# Patient Record
Sex: Male | Born: 1975
Health system: Southern US, Community
[De-identification: ages and names within clinical notes are randomized; demographics above are authoritative.]

## PROBLEM LIST (undated history)

## (undated) DIAGNOSIS — J9819 Other pulmonary collapse: Secondary | ICD-10-CM

## (undated) DIAGNOSIS — M722 Plantar fascial fibromatosis: Secondary | ICD-10-CM

## (undated) HISTORY — PX: KNEE ARTHROSCOPY: SUR90

## (undated) HISTORY — DX: Other pulmonary collapse: J98.19

## (undated) HISTORY — DX: Plantar fascial fibromatosis: M72.2

---

## 2013-01-09 ENCOUNTER — Emergency Department (HOSPITAL_COMMUNITY)
Admission: EM | Admit: 2013-01-09 | Discharge: 2013-01-09 | Disposition: A | Discharge status: Home / Self Care | Payer: Managed Care, Other (non HMO) | Source: Home / Self Care

## 2013-01-09 ENCOUNTER — Encounter (HOSPITAL_COMMUNITY): Payer: Self-pay | Admitting: Emergency Medicine

## 2013-01-09 DIAGNOSIS — J029 Acute pharyngitis, unspecified: Secondary | ICD-10-CM

## 2013-01-09 NOTE — ED Notes (Signed)
Patient reports having strep throat, history of the same.  Throat swollen, aches and chills.  Reports child is sick.  Onset of symptoms yesterday afternoon

## 2013-01-09 NOTE — ED Provider Notes (Signed)
Bryan Willis is a 37 y.o. male who presents to Urgent Care today for sore throat starting yesterday associated with body aches and a mild cough and chills. No fevers nausea vomiting or diarrhea. This is similar to prior episodes of strep throat. He has not tried any medications for this. He continues taking his regular daily over-the-counter Zyrtec for seasonal allergies. He has positive sick contacts. His son is sick with a similar illness.   PMH reviewed. Healthy otherwise History  Substance Use Topics  . Smoking status: Never Smoker   . Smokeless tobacco: Not on file  . Alcohol Use: Yes   ROS as above Medications reviewed. No current facility-administered medications for this encounter.   Current Outpatient Prescriptions  Medication Sig Dispense Refill  . cetirizine (ZYRTEC) 10 MG tablet Take 10 mg by mouth daily.        Exam:  BP 117/76  Pulse 58  Temp(Src) 98 F (36.7 C) (Oral)  Resp 16  SpO2 99% Gen: Well NAD HEENT: EOMI,  MMM, mildly erythematous posterior pharynx without exudate. Bilateral anterior cervical lymphadenopathy present Lungs: CTABL Nl WOB Heart: RRR no MRG Abd: NABS, NT, ND Exts: Non edematous BL  LE, warm and well perfused.   Results for orders placed during the hospital encounter of 01/09/13 (from the past 24 hour(s))  POCT RAPID STREP A ([facility]RG CARE ONLY)     Status: None   Collection Time    01/09/13 10:08 AM      Result Value Range   Streptococcus, Group A Screen (Direct) NEGATIVE  NEGATIVE   No results found.  Assessment and Plan: 37 y.o. male with likely viral pharyngitis. Rapid strep test is negative, the culture is pending.  Plan to treat symptomatically with ibuprofen or Tylenol. Continue Zyrtec.  Will call patient with culture results if positive. Discussed warning signs or symptoms. Please see discharge instructions. Patient expresses understanding.       Rodolph Bong, MD 01/09/13 1021

## 2013-01-09 NOTE — Discharge Instructions (Signed)
Thank you for coming in today. You have a virus likely.  Please continue ibuprofen or tylenol.  Come back as needed.  We will call you with your test results.    Sore Throat A sore throat is pain, burning, irritation, or scratchiness of the throat. There is often pain or tenderness when swallowing or talking. A sore throat may be accompanied by other symptoms, such as coughing, sneezing, fever, and swollen neck glands. A sore throat is often the first sign of another sickness, such as a cold, flu, strep throat, or mononucleosis (commonly known as mono). Most sore throats go away without medical treatment. CAUSES  The most common causes of a sore throat include:  A viral infection, such as a cold, flu, or mono.  A bacterial infection, such as strep throat, tonsillitis, or whooping cough.  Seasonal allergies.  Dryness in the air.  Irritants, such as smoke or pollution.  Gastroesophageal reflux disease (GERD). HOME CARE INSTRUCTIONS   Only take over-the-counter medicines as directed by your caregiver.  Drink enough fluids to keep your urine clear or pale yellow.  Rest as needed.  Try using throat sprays, lozenges, or sucking on hard candy to ease any pain (if older than 4 years or as directed).  Sip warm liquids, such as broth, herbal tea, or warm water with honey to relieve pain temporarily. You may also eat or drink cold or frozen liquids such as frozen ice pops.  Gargle with salt water (mix 1 tsp salt with 8 oz of water).  Do not smoke and avoid secondhand smoke.  Put a cool-mist humidifier in your bedroom at night to moisten the air. You can also turn on a hot shower and sit in the bathroom with the door closed for 5 10 minutes. SEEK IMMEDIATE MEDICAL CARE IF:  You have difficulty breathing.  You are unable to swallow fluids, soft foods, or your saliva.  You have increased swelling in the throat.  Your sore throat does not get better in 7 days.  You have nausea and  vomiting.  You have a fever or persistent symptoms for more than 2 3 days.  You have a fever and your symptoms suddenly get worse. MAKE SURE YOU:   Understand these instructions.  Will watch your condition.  Will get help right away if you are not doing well or get worse. Document Released: 07/12/2004 Document Revised: 05/21/2012 Document Reviewed: 02/10/2012 [facility] Patient Information 2014 [place], [place].

## 2013-01-11 LAB — CULTURE, GROUP A STREP

## 2015-03-15 ENCOUNTER — Encounter: Payer: Self-pay | Admitting: Internal Medicine

## 2016-01-18 ENCOUNTER — Encounter: Payer: Self-pay | Admitting: Internal Medicine

## 2016-03-06 ENCOUNTER — Ambulatory Visit (INDEPENDENT_AMBULATORY_CARE_PROVIDER_SITE_OTHER): Payer: 59

## 2016-03-06 ENCOUNTER — Other Ambulatory Visit: Payer: Self-pay | Admitting: *Deleted

## 2016-03-06 ENCOUNTER — Ambulatory Visit (INDEPENDENT_AMBULATORY_CARE_PROVIDER_SITE_OTHER): Payer: 59 | Admitting: Podiatry

## 2016-03-06 VITALS — BP 110/70 | HR 46 | Resp 16 | Ht 72.0 in | Wt 195.0 lb

## 2016-03-06 DIAGNOSIS — M79671 Pain in right foot: Secondary | ICD-10-CM

## 2016-03-06 DIAGNOSIS — M722 Plantar fascial fibromatosis: Secondary | ICD-10-CM | POA: Diagnosis not present

## 2016-03-06 MED ORDER — MELOXICAM 15 MG PO TABS: 15.0000 mg | ORAL_TABLET | Freq: Every day | ORAL | 3 refills | Status: DC

## 2016-03-06 MED ORDER — METHYLPREDNISOLONE 4 MG PO TBPK: ORAL_TABLET | ORAL | 0 refills | Status: DC

## 2016-03-06 NOTE — Patient Instructions (Signed)

## 2016-03-06 NOTE — Progress Notes (Signed)
   Subjective:    Patient ID: Bryan Willis, male    DOB: December 20, 1975, 40 y.o.   MRN: [id number]  HPI  Chief Complaint  Patient presents with  . Foot Pain    right heel pain x 8 mo ... pt states pain is worse first thing in the a.m. and after periods of rest.     States this seems to be getting worse rather than better    Review of Systems  All other systems reviewed and are negative.      Objective:   Physical Exam vital signs are stable alert and oriented 3. Pulses are palpable. Neurologic sensorium is intact. Deep tendon reflexes are intact. Muscle strength +5 over 5 dorsiflexion plantar flexors and inverters everters all into the musculature is intact. Orthopedic evaluation demonstrates all joints distal to the ankle for range of motion without crepitation. His severe pain on palpation medial calcaneal tubercle of the right heel. Radiographs taken today do demonstrate soft tissue increase in density of the plantar fascia calcaneal insertion site with small plantar distally oriented calcaneal heel spur. Cutaneous evaluation does not demonstrate any type of open lesions or wounds.        Assessment & Plan:  Plantar fasciitis right heel.  Plan: I injected the right heel today with Kenalog and local anesthetic placed him on a Medrol Dosepak to be followed by meloxicam. Dispensed a plantar fascia brace and a night splint. Discussed appropriate shoe gear stretching exercises ice therapy she can modifications area we discussed the etiology pathology conservative versus surgical therapies. Also dispensed both oral and written home going stretching instructions. I will follow up with him in 1 month.

## 2016-04-05 ENCOUNTER — Ambulatory Visit: Payer: 59 | Admitting: Podiatry

## 2016-04-10 ENCOUNTER — Ambulatory Visit (INDEPENDENT_AMBULATORY_CARE_PROVIDER_SITE_OTHER): Payer: 59 | Admitting: Podiatry

## 2016-04-10 ENCOUNTER — Encounter: Payer: Self-pay | Admitting: Podiatry

## 2016-04-10 DIAGNOSIS — M722 Plantar fascial fibromatosis: Secondary | ICD-10-CM

## 2016-04-10 NOTE — Progress Notes (Signed)
He presents today for follow-up of his plantar fasciitis in his right heel. He states that he has been doing everything to best of his ability for the past month. He states that his foot is feeling much better proximally 75-80% improved at this point.  Objective: Vital signs are stable he's alert and oriented 3. Pulses are palpable. He retains pain on palpation medial calcaneal tubercle of the right heel that he'll was not as firm nor is it as warm as it was in the past.  Assessment: Resolving plantar fasciitis right foot. 80% improved.  Plan: We injected the right heel today with Kenalog and local anesthetic. I encouraged him to continue all conservative therapies including anti-inflammatories, ankle plantar fascia brace and night splint. Also discussed appropriate shoe gear shows no shows and ice therapy. Follow up with him in 1 month unless he does not need us.

## 2016-05-08 ENCOUNTER — Encounter (INDEPENDENT_AMBULATORY_CARE_PROVIDER_SITE_OTHER): Payer: 59 | Admitting: Podiatry

## 2016-05-08 NOTE — Progress Notes (Signed)
This encounter was created in error - please disregard.

## 2016-09-04 ENCOUNTER — Encounter: Payer: Self-pay | Admitting: Podiatry

## 2016-09-04 ENCOUNTER — Ambulatory Visit (INDEPENDENT_AMBULATORY_CARE_PROVIDER_SITE_OTHER): Payer: 59 | Admitting: Podiatry

## 2016-09-04 DIAGNOSIS — M722 Plantar fascial fibromatosis: Secondary | ICD-10-CM

## 2016-09-04 MED ORDER — METHYLPREDNISOLONE 4 MG PO TBPK: ORAL_TABLET | ORAL | 0 refills | Status: DC

## 2016-09-04 MED ORDER — DICLOFENAC SODIUM 75 MG PO TBEC: 75.0000 mg | DELAYED_RELEASE_TABLET | Freq: Two times a day (BID) | ORAL | 0 refills | Status: DC

## 2016-09-04 NOTE — Progress Notes (Signed)
He presents today for follow-up of his plantar fasciitis of his right heel. He has not been in since October 2017. He states that I was doing very well almost completely healed and heel pain came back with a vengeance. He states that he stopped taking meloxicam because it really didn't seem to help any better and the ibuprofen did.  Objective: Vital signs are stable alert and oriented 3. Pulses are palpable. Neurologic sensory is intact. Deep tendon reflexes are intact. Muscle strength is 5 over 5 dorsiflexion plantar flexors and inverters everters onto the musculatures intact has pain on palpation medial calcaneal tubercle of the right foot.  Assessment: Plantar fasciitis right foot.  Plan: Started over with his therapy including a Medrol Dosepak switched him from meloxicam to diclofenac. He will continue his plantar fascia brace and his night splint. Discussed appropriate shoe gear stretching exercises ice therapy and the need for orthotics. I reinjected his right heel once again today I will follow-up with him in 1 month

## 2016-10-02 ENCOUNTER — Encounter: Payer: Self-pay | Admitting: Podiatry

## 2016-10-02 ENCOUNTER — Ambulatory Visit (INDEPENDENT_AMBULATORY_CARE_PROVIDER_SITE_OTHER): Payer: 59 | Admitting: Podiatry

## 2016-10-02 DIAGNOSIS — M722 Plantar fascial fibromatosis: Secondary | ICD-10-CM | POA: Diagnosis not present

## 2016-10-02 NOTE — Progress Notes (Signed)
He presents today states that he is approximately 50% improved.  Objective: Vital signs are stable he is alert and oriented 3. Pulses are palpable. He has pain on palpation mucogingival the right heel.  Assessment: Plantar fasciitis of the right heel.  Plan: We'll recommend orthotics today he was casted by Raiford Noble today. We will inject him when he picks up his orthotics if he is still painful at day and not improving.

## 2016-10-23 ENCOUNTER — Other Ambulatory Visit: Payer: 59

## 2016-12-16 HISTORY — PX: VASECTOMY: SHX75

## 2016-12-31 DIAGNOSIS — Z0000 Encounter for general adult medical examination without abnormal findings: Secondary | ICD-10-CM | POA: Diagnosis not present

## 2017-01-22 ENCOUNTER — Encounter: Payer: Self-pay | Admitting: Gastroenterology

## 2017-01-22 DIAGNOSIS — Z0000 Encounter for general adult medical examination without abnormal findings: Secondary | ICD-10-CM | POA: Diagnosis not present

## 2017-03-19 ENCOUNTER — Encounter: Payer: Self-pay | Admitting: Gastroenterology

## 2017-03-19 ENCOUNTER — Encounter (INDEPENDENT_AMBULATORY_CARE_PROVIDER_SITE_OTHER): Payer: Self-pay

## 2017-03-19 ENCOUNTER — Ambulatory Visit (INDEPENDENT_AMBULATORY_CARE_PROVIDER_SITE_OTHER): Payer: 59 | Admitting: Gastroenterology

## 2017-03-19 VITALS — BP 110/78 | HR 56 | Ht 72.0 in | Wt 208.2 lb

## 2017-03-19 DIAGNOSIS — Z800 Family history of malignant neoplasm of digestive organs: Secondary | ICD-10-CM

## 2017-03-19 MED ORDER — NA SULFATE-K SULFATE-MG SULF 17.5-3.13-1.6 GM/177ML PO SOLN: 1.0000 | Freq: Once | ORAL | 0 refills | Status: AC

## 2017-03-19 NOTE — Patient Instructions (Addendum)
If you are age 42 or older, your body mass index should be between 23-30. Your Body mass index is 28.24 kg/m. If this is out of the aforementioned range listed, please consider follow up with your Primary Care Provider.  If you are age 59 or younger, your body mass index should be between 19-25. Your Body mass index is 28.24 kg/m. If this is out of the aformentioned range listed, please consider follow up with your Primary Care Provider.   You have been scheduled for a colonoscopy. Please follow written instructions given to you at your visit today.  Please pick up your prep supplies at the pharmacy within the next 1-3 days. If you use inhalers (even only as needed), please bring them with you on the day of your procedure. Your physician has requested that you go to www.startemmi.com and enter the access code given to you at your visit today. This web site gives a general overview about your procedure. However, you should still follow specific instructions given to you by our office regarding your preparation for the procedure.  Thank you for choosing [facility]  Dr Amada Jupiter III

## 2017-03-19 NOTE — Progress Notes (Signed)
    [facility] Consult Note:  History: Bryan Willis 03/19/2017  Referring physician: Deland Pretty, MD  Reason for consult/chief complaint: Colon Cancer Screening (Family hx of colon cancer, patient has no compliants)   Subjective  HPI:  This is a 41 year old man referred for a family history of colon cancer. He is feeling well, denies abdominal pain, altered bowel habits or rectal bleeding. His appetite is good, and he has no chronic upper digestive symptoms. His father developed colon cancer at age 79, and was cured with surgery and chemotherapy.   ROS:  Review of Systems He denies chest pain dyspnea or dysuria  Past Medical History: Past Medical History:  Diagnosis Date  . Plantar fasciitis      Past Surgical History: Past Surgical History:  Procedure Laterality Date  . KNEE ARTHROSCOPY    . VASECTOMY  12/2016     Family History: Family History  Problem Relation Age of Onset  . Colon cancer Father   . Diabetes Father   Dad Dx age 76, surgery and CTX Pat uncles without CRC   Social History: Social History   Social History  . Marital status: Married    Spouse name: N/A  . Number of children: 3  . Years of education: N/A   Occupational History  . land developer    Social History Main Topics  . Smoking status: Never Smoker  . Smokeless tobacco: Never Used  . Alcohol use Yes     Comment: occ  . Drug use: No  . Sexual activity: Yes    Partners: Female   Other Topics Concern  . None   Social History Narrative  . None   Builder and Engineer, structural  Allergies: Allergies  Allergen Reactions  . Bactrim [Sulfamethoxazole-Trimethoprim] Other (See Comments)    Pt stated, "Gets the shakes; feels like has the flu and gets redness of the skin"    Outpatient Meds: Current Outpatient Prescriptions  Medication Sig Dispense Refill  . cetirizine (ZYRTEC) 10 MG tablet Take 10 mg by mouth daily.    . Na Sulfate-K Sulfate-Mg  Sulf 17.5-3.13-1.6 GM/180ML SOLN Take 1 kit by mouth once. 354 mL 0   No current facility-administered medications for this visit.       ___________________________________________________________________ Objective   Exam:  BP 110/78   Pulse (!) 56   Ht 6' (1.829 m)   Wt 208 lb 3.2 oz (94.4 kg)   BMI 28.24 kg/m    General: this is a(n) Well-appearing man   Eyes: sclera anicteric, no redness  ENT: oral mucosa moist without lesions, no cervical or supraclavicular lymphadenopathy, good dentition  CV: RRR without murmur, S1/S2, no JVD, no peripheral edema  Resp: clear to auscultation bilaterally, normal RR and effort noted  GI: soft, no tenderness, with active bowel sounds. No guarding or palpable organomegaly noted.  Skin; warm and dry, no rash or jaundice noted  Neuro: awake, alert and oriented x 3. Normal gross motor function and fluent speech  Labs:  Cbc normal in July with PCP   Assessment: Encounter Diagnosis  Name Primary?  . Family history of colon cancer in father Yes   We discussed current screening guidelines.  Plan:  Colonoscopy.  Thank you for the courtesy of this consult.  Please call me with any questions or concerns.  Nelida Meuse III  CC: Deland Pretty, MD

## 2017-04-12 ENCOUNTER — Encounter: Payer: Self-pay | Admitting: Gastroenterology

## 2017-04-15 ENCOUNTER — Encounter: Payer: Self-pay | Admitting: Gastroenterology

## 2017-04-24 ENCOUNTER — Other Ambulatory Visit: Payer: Self-pay

## 2017-04-24 ENCOUNTER — Encounter: Payer: Self-pay | Admitting: Gastroenterology

## 2017-04-24 ENCOUNTER — Ambulatory Visit (AMBULATORY_SURGERY_CENTER): Payer: 59 | Admitting: Gastroenterology

## 2017-04-24 VITALS — BP 118/68 | HR 58 | Temp 98.4°F | Resp 13 | Ht 72.0 in | Wt 208.0 lb

## 2017-04-24 DIAGNOSIS — Z1212 Encounter for screening for malignant neoplasm of rectum: Secondary | ICD-10-CM

## 2017-04-24 DIAGNOSIS — Z800 Family history of malignant neoplasm of digestive organs: Secondary | ICD-10-CM

## 2017-04-24 DIAGNOSIS — Z1211 Encounter for screening for malignant neoplasm of colon: Secondary | ICD-10-CM

## 2017-04-24 MED ORDER — SODIUM CHLORIDE 0.9 % IV SOLN: 500.0000 mL | INTRAVENOUS | Status: DC

## 2017-04-24 NOTE — Progress Notes (Signed)
No problems noted in the recovery room. maw 

## 2017-04-24 NOTE — Progress Notes (Signed)
Report given to PACU, vss 

## 2017-04-24 NOTE — Patient Instructions (Signed)
YOU HAD AN ENDOSCOPIC PROCEDURE TODAY AT THE [facility]:   Refer to the procedure report that was given to you for any specific questions about what was found during the examination.  If the procedure report does not answer your questions, please call your gastroenterologist to clarify.  If you requested that your care partner not be given the details of your procedure findings, then the procedure report has been included in a sealed envelope for you to review at your convenience later.  YOU SHOULD EXPECT: Some feelings of bloating in the abdomen. Passage of more gas than usual.  Walking can help get rid of the air that was put into your GI tract during the procedure and reduce the bloating. If you had a lower endoscopy (such as a colonoscopy or flexible sigmoidoscopy) you may notice spotting of blood in your stool or on the toilet paper. If you underwent a bowel prep for your procedure, you may not have a normal bowel movement for a few days.  Please Note:  You might notice some irritation and congestion in your nose or some drainage.  This is from the oxygen used during your procedure.  There is no need for concern and it should clear up in a day or so.  SYMPTOMS TO REPORT IMMEDIATELY:   Following lower endoscopy (colonoscopy or flexible sigmoidoscopy):  Excessive amounts of blood in the stool  Significant tenderness or worsening of abdominal pains  Swelling of the abdomen that is new, acute  Fever of 100F or higher   For urgent or emergent issues, a gastroenterologist can be reached at any hour by calling [phone].   DIET:  We do recommend a small meal at first, but then you may proceed to your regular diet.  Drink plenty of fluids but you should avoid alcoholic beverages for 24 hours.  ACTIVITY:  You should plan to take it easy for the rest of today and you should NOT DRIVE or use heavy machinery until tomorrow (because of the sedation medicines used during the test).     FOLLOW UP: Our staff will call the number listed on your records the next business day following your procedure to check on you and address any questions or concerns that you may have regarding the information given to you following your procedure. If we do not reach you, we will leave a message.  However, if you are feeling well and you are not experiencing any problems, there is no need to return our call.  We will assume that you have returned to your regular daily activities without incident.  If any biopsies were taken you will be contacted by phone or by letter within the next 1-3 weeks.  Please call us at [phone] if you have not heard about the biopsies in 3 weeks.    SIGNATURES/CONFIDENTIALITY: You and/or your care partner have signed paperwork which will be entered into your electronic medical record.  These signatures attest to the fact that that the information above on your After Visit Summary has been reviewed and is understood.  Full responsibility of the confidentiality of this discharge information lies with you and/or your care-partner.   Handouts were given to your care partner on hemorrhoids. You may resume your current medications today. Next colonoscopy for screening purposes is 5 years. Please call if any questions or concerns.

## 2017-04-24 NOTE — Op Note (Signed)
[facility] Patient Name: Bryan Willis Procedure Date: 04/24/2017 2:23 PM MRN: [id number] Endoscopist: Sherilyn CooterHenry L. Myrtie Neitheranis , MD Age: [age] Referring MD:  Date of Birth: 1975/11/27 Gender: Male Account #: [id number] Procedure:                Colonoscopy Indications:              Screening in patient at increased risk: Colorectal                            cancer in father before age 860, This is the                            patient's first colonoscopy Medicines:                Monitored Anesthesia Care Procedure:                Pre-Anesthesia Assessment:                           - Prior to the procedure, a History and Physical                            was performed, and patient medications and                            allergies were reviewed. The patient's tolerance of                            previous anesthesia was also reviewed. The risks                            and benefits of the procedure and the sedation                            options and risks were discussed with the patient.                            All questions were answered, and informed consent                            was obtained. Prior Anticoagulants: The patient has                            taken no previous anticoagulant or antiplatelet                            agents. ASA Grade Assessment: I - A normal, healthy                            patient. After reviewing the risks and benefits,                            the patient was deemed in satisfactory condition to  undergo the procedure.                           After obtaining informed consent, the colonoscope                            was passed under direct vision. Throughout the                            procedure, the patient's blood pressure, pulse, and                            oxygen saturations were monitored continuously. The                            Model CF-HQ190L [id number]) scope was introduced                             through the anus and advanced to the the cecum,                            identified by appendiceal orifice and ileocecal                            valve. The colonoscopy was performed without                            difficulty. The patient tolerated the procedure                            well. The quality of the bowel preparation was                            excellent. The ileocecal valve, appendiceal                            orifice, and rectum were photographed. The quality                            of the bowel preparation was evaluated using the                            BBPS [facility] Bowel Preparation Scale) with scores                            of: Right Colon = 3, Transverse Colon = 3 and Left                            Colon = 3 (entire mucosa seen well with no residual                            staining, small fragments of stool or opaque  liquid). The total BBPS score equals 9. The bowel                            preparation used was SUPREP. Scope In: 2:31:50 PM Scope Out: 2:42:15 PM Scope Withdrawal Time: 0 hours 8 minutes 29 seconds  Total Procedure Duration: 0 hours 10 minutes 25 seconds  Findings:                 The perianal and digital rectal examinations were                            normal.                           Internal hemorrhoids were found. The hemorrhoids                            were small and Grade I (internal hemorrhoids that                            do not prolapse).                           The exam was otherwise without abnormality on                            direct and retroflexion views. Complications:            No immediate complications. Estimated Blood Loss:     Estimated blood loss: none. Estimated blood loss:                            none. Impression:               - Internal hemorrhoids.                           - The examination was otherwise normal on direct                             and retroflexion views.                           - No specimens collected. Recommendation:           - Patient has a contact number available for                            emergencies. The signs and symptoms of potential                            delayed complications were discussed with the                            patient. Return to normal activities tomorrow.                            Written discharge instructions  were provided to the                            patient.                           - Resume previous diet.                           - Continue present medications.                           - Repeat colonoscopy in 5 years for screening                            purposes. Dale Ribeiro L. Myrtie Neitheranis, MD 04/24/2017 2:45:43 PM This report has been signed electronically.

## 2017-04-25 ENCOUNTER — Telehealth: Payer: Self-pay

## 2017-04-25 NOTE — Telephone Encounter (Signed)
  Follow up Call-  Call back number 04/24/2017  Post procedure Call Back phone  # [phone]  Permission to leave phone message Yes  Some recent data might be hidden     Patient questions:  Do you have a fever, pain , or abdominal swelling? No. Pain Score  0 *  Have you tolerated food without any problems? Yes.    Have you been able to return to your normal activities? Yes.    Do you have any questions about your discharge instructions: Diet   No. Medications  No. Follow up visit  No.  Do you have questions or concerns about your Care? No.  Actions: * If pain score is 4 or above: No action needed, pain <4.

## 2018-01-22 DIAGNOSIS — Z0000 Encounter for general adult medical examination without abnormal findings: Secondary | ICD-10-CM | POA: Diagnosis not present

## 2018-03-27 DIAGNOSIS — Z23 Encounter for immunization: Secondary | ICD-10-CM | POA: Diagnosis not present

## 2018-03-27 DIAGNOSIS — D72819 Decreased white blood cell count, unspecified: Secondary | ICD-10-CM | POA: Diagnosis not present

## 2018-03-27 DIAGNOSIS — Z800 Family history of malignant neoplasm of digestive organs: Secondary | ICD-10-CM | POA: Diagnosis not present

## 2018-03-27 DIAGNOSIS — Z0000 Encounter for general adult medical examination without abnormal findings: Secondary | ICD-10-CM | POA: Diagnosis not present

## 2019-03-09 ENCOUNTER — Telehealth: Payer: 59 | Admitting: Physician Assistant

## 2019-03-09 ENCOUNTER — Other Ambulatory Visit: Payer: Self-pay

## 2019-03-09 ENCOUNTER — Ambulatory Visit (HOSPITAL_COMMUNITY)
Admission: EM | Admit: 2019-03-09 | Discharge: 2019-03-09 | Disposition: A | Discharge status: Home / Self Care | Payer: BC Managed Care – PPO | Attending: Family Medicine | Admitting: Family Medicine

## 2019-03-09 ENCOUNTER — Encounter (HOSPITAL_COMMUNITY): Payer: Self-pay | Admitting: Emergency Medicine

## 2019-03-09 DIAGNOSIS — H6090 Unspecified otitis externa, unspecified ear: Secondary | ICD-10-CM

## 2019-03-09 DIAGNOSIS — H60331 Swimmer's ear, right ear: Secondary | ICD-10-CM | POA: Diagnosis not present

## 2019-03-09 MED ORDER — NEOMYCIN-POLYMYXIN-HC 3.5-10000-1 OT SUSP: 3.0000 [drp] | Freq: Three times a day (TID) | OTIC | 0 refills | Status: DC

## 2019-03-09 NOTE — ED Triage Notes (Signed)
Right ear pain since labor day weekend.  Since Thursday or Friday has noticed increased pain and stuffiness in ear.  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

## 2019-03-09 NOTE — Discharge Instructions (Signed)
Use the antibiotic 3 times a day until your pain resolves, probably a 3 to 5 days You must lay on your side with your barrier up, put the drops in, rest for 10 minutes and then go about your way

## 2019-03-09 NOTE — Progress Notes (Signed)
Hi Bryan Willis,  Thank you for the details.  I am concerned about your ear and feel your condition warrants further evaluation and I recommend that you be seen for a face to face office visit.  Depending on how swollen your ear is, you may need a wick placed in additional to antibiotic drops.    NOTE: If you entered your credit card information for this eVisit, you will not be charged. You may see a "hold" on your card for the $35 but that hold will drop off and you will not have a charge processed.  If you are having a true medical emergency please call 911.     For an urgent face to face visit, [facility] has four urgent care centers for your convenience:   . [facility]    [phone]                  Get Driving Directions  [address], [place] [address] . 10 am to 8 pm Monday-Friday . 12 pm to 8 pm Saturday-Sunday   . [facility] [place]                  Get Driving Directions  [address], [place], [place] [address] . 8 am to 8 pm Monday-Friday . 9 am to 6 pm Saturday . 11 am to 6 pm Sunday   . [facility] [place]                  Get Driving Directions   [address] [place],  [address] . 8 am to 8 pm Monday-Friday . 8 am to 4 pm Saturday-Sunday    . [facility] [place]                    Get Driving Directions  [phone]  [address], [place],  [address]  . Monday-Friday, 12 PM to 6 PM    Your e-visit answers were reviewed by a board certified advanced clinical practitioner to complete your personal care plan.  Thank you for using e-Visits.

## 2019-03-09 NOTE — ED Provider Notes (Signed)
[name]    CSN: [id number] Arrival date & time: 03/09/19  [place]      History   Chief Complaint Chief Complaint  Patient presents with  . Sore Throat  . Otalgia    HPI Bryan Willis is a 43 y.o. male.   HPI  Ear pain right ear [place].  It hurts to pull on the ear.  No drainage from the ear.  Hearing is normal.  No cough cold runny nose or sore throat.  He states his throat was scratchy on Sunday after screaming at a soccer game on Saturday, but this is better today.  No fever.  No suspected illness  Past Medical History:  Diagnosis Date  . Plantar fasciitis     There are no active problems to display for this patient.   Past Surgical History:  Procedure Laterality Date  . KNEE ARTHROSCOPY    . VASECTOMY  12/2016       Home Medications    Prior to Admission medications   Medication Sig Start Date End Date Taking? Authorizing Provider  cetirizine (ZYRTEC) 10 MG tablet Take 10 mg by mouth daily.   Yes [provider]  neomycin-polymyxin-hydrocortisone (CORTISPORIN) 3.5-10000-1 OTIC suspension Place 3 drops into the right ear 3 (three) times daily. 03/09/19   Raylene Everts, MD    Family History Family History  Problem Relation Age of Onset  . Colon cancer Father   . Diabetes Father     Social History Social History   Tobacco Use  . Smoking status: Never Smoker  . Smokeless tobacco: Never Used  Substance Use Topics  . Alcohol use: Yes    Comment: occ  . Drug use: No     Allergies   Bactrim [sulfamethoxazole-trimethoprim]   Review of Systems Review of Systems  Constitutional: Negative for chills and fever.  HENT: Positive for ear pain. Negative for sore throat.   Eyes: Negative for pain and visual disturbance.  Respiratory: Negative for cough and shortness of breath.   Cardiovascular: Negative for chest pain and palpitations.  Gastrointestinal: Negative for abdominal pain  and vomiting.  Genitourinary: Negative for dysuria and hematuria.  Musculoskeletal: Negative for arthralgias and back pain.  Skin: Negative for color change and rash.  Neurological: Negative for seizures and syncope.  All other systems reviewed and are negative.    Physical Exam Triage Vital Signs ED Triage Vitals  Enc Vitals Group     BP 03/09/19 1948 112/69     Pulse Rate 03/09/19 1948 (!) 56     Resp 03/09/19 1948 20     Temp 03/09/19 1948 98 F (36.7 C)     Temp Source 03/09/19 1948 Oral     SpO2 03/09/19 1948 99 %     Weight --      Height --      Head Circumference --      Peak Flow --      Pain Score 03/09/19 1944 8     Pain Loc --      Pain Edu? --      Excl. in [place]? --    No data found.  Updated Vital Signs BP 112/69 (BP Location: Right Arm)   Pulse (!) 56   Temp 98 F (36.7 C) (Oral)   Resp 20   SpO2 99%       Physical Exam Constitutional:      General: He is not in acute  distress.    Appearance: He is well-developed.  HENT:     Head: Normocephalic and atraumatic.     Right Ear: Tympanic membrane normal. Swelling and tenderness present. Tympanic membrane is not erythematous.     Left Ear: Tympanic membrane and ear canal normal.     Mouth/Throat:     Mouth: Mucous membranes are moist.     Pharynx: No pharyngeal swelling.  Eyes:     Conjunctiva/sclera: Conjunctivae normal.     Pupils: Pupils are equal, round, and reactive to light.  Neck:     Musculoskeletal: Normal range of motion.  Cardiovascular:     Rate and Rhythm: Normal rate.  Pulmonary:     Effort: Pulmonary effort is normal. No respiratory distress.  Abdominal:     General: There is no distension.     Palpations: Abdomen is soft.  Musculoskeletal: Normal range of motion.  Skin:    General: Skin is warm and dry.  Neurological:     Mental Status: He is alert.  Psychiatric:        Behavior: Behavior normal.      UC Treatments / Results  Labs (all labs ordered are listed, but  only abnormal results are displayed) Labs Reviewed - No data to display  EKG   Radiology No results found.  Procedures Procedures (including critical care time)  Medications Ordered in UC Medications - No data to display  Initial Impression / Assessment and Plan / UC Course  I have reviewed the triage vital signs and the nursing notes.  Pertinent labs & imaging results that were available during my care of the patient were reviewed by me and considered in my medical decision making (see chart for details).     Discussed otitis externa.  Treatment and expected recovery Final Clinical Impressions(s) / UC Diagnoses   Final diagnoses:  Acute swimmer's ear of right side     Discharge Instructions     Use the antibiotic 3 times a day until your pain resolves, probably a 3 to 5 days You must lay on your side with your barrier up, put the drops in, rest for 10 minutes and then go about your way   ED Prescriptions    Medication Sig Dispense Auth. Provider   neomycin-polymyxin-hydrocortisone (CORTISPORIN) 3.5-10000-1 OTIC suspension Place 3 drops into the right ear 3 (three) times daily. 10 mL Eustace MooreNelson, Sheza Strickland Sue, MD     PDMP not reviewed this encounter.   Eustace MooreNelson, Bernie Fobes Sue, MD 03/09/19 2038

## 2019-12-03 ENCOUNTER — Ambulatory Visit (HOSPITAL_COMMUNITY)
Admission: EM | Admit: 2019-12-03 | Discharge: 2019-12-03 | Disposition: A | Discharge status: Home / Self Care | Payer: BC Managed Care – PPO | Attending: Family Medicine | Admitting: Family Medicine

## 2019-12-03 ENCOUNTER — Encounter (HOSPITAL_COMMUNITY): Payer: Self-pay

## 2019-12-03 DIAGNOSIS — J069 Acute upper respiratory infection, unspecified: Secondary | ICD-10-CM | POA: Insufficient documentation

## 2019-12-03 DIAGNOSIS — Z20822 Contact with and (suspected) exposure to covid-19: Secondary | ICD-10-CM | POA: Insufficient documentation

## 2019-12-03 DIAGNOSIS — Z881 Allergy status to other antibiotic agents status: Secondary | ICD-10-CM | POA: Insufficient documentation

## 2019-12-03 DIAGNOSIS — H6692 Otitis media, unspecified, left ear: Secondary | ICD-10-CM | POA: Diagnosis not present

## 2019-12-03 MED ORDER — AMOXICILLIN 500 MG PO CAPS: 1000.0000 mg | ORAL_CAPSULE | Freq: Three times a day (TID) | ORAL | 0 refills | Status: AC

## 2019-12-03 NOTE — Discharge Instructions (Addendum)
Treating you for an ear infection Take the antibiotic as prescribed Ibuprofen for pain as needed.  Flonase and zyrtec.  Follow up as needed for continued or worsening symptoms

## 2019-12-03 NOTE — ED Triage Notes (Signed)
Pt c/o 4/10 constant dull left ear painx1 wk. Last night nasal congestion, sore throat started.

## 2019-12-03 NOTE — ED Provider Notes (Signed)
[facility]    CSN: [id number] Arrival date & time: 12/03/19  1034      History   Chief Complaint Chief Complaint  Patient presents with  . sore throat, runnynose    HPI Bryan Willis is a 44 y.o. male.   Patient is a 44 year old male who presents today with constant, dull left ear pain for the past week or so.  Symptoms have been constant.  Describes as pressure, aching.  Has not take anything for the symptoms.  Does have a history of seasonal allergies.  Not currently taking Flonase or Zyrtec.  Last night he started with some nasal congestion, mild sore throat.  No fevers, chills, bodies or night sweats.  ROS per HPI      Past Medical History:  Diagnosis Date  . Plantar fasciitis     There are no problems to display for this patient.   Past Surgical History:  Procedure Laterality Date  . KNEE ARTHROSCOPY    . VASECTOMY  12/2016       Home Medications    Prior to Admission medications   Medication Sig Start Date End Date Taking? Authorizing Provider  amoxicillin (AMOXIL) 500 MG capsule Take 2 capsules (1,000 mg total) by mouth 3 (three) times daily for 5 days. 12/03/19 12/08/19  Dahlia Byes A, NP  cetirizine (ZYRTEC) 10 MG tablet Take 10 mg by mouth daily.    [provider]    Family History Family History  Problem Relation Age of Onset  . Colon cancer Father   . Diabetes Father     Social History Social History   Tobacco Use  . Smoking status: Never Smoker  . Smokeless tobacco: Never Used  Vaping Use  . Vaping Use: Never used  Substance Use Topics  . Alcohol use: Yes    Comment: occ  . Drug use: No     Allergies   Bactrim [sulfamethoxazole-trimethoprim]   Review of Systems Review of Systems   Physical Exam Triage Vital Signs ED Triage Vitals  Enc Vitals Group     BP 12/03/19 1124 131/88     Pulse Rate 12/03/19 1124 (!) 55     Resp 12/03/19 1124 16     Temp 12/03/19 1124 97.6 F (36.4 C)     Temp  Source 12/03/19 1124 Oral     SpO2 12/03/19 1124 100 %     Weight 12/03/19 1125 210 lb (95.3 kg)     Height 12/03/19 1125 6' (1.829 m)     Head Circumference --      Peak Flow --      Pain Score 12/03/19 1125 4     Pain Loc --      Pain Edu? --      Excl. in [place]? --    No data found.  Updated Vital Signs BP 131/88   Pulse (!) 55   Temp 97.6 F (36.4 C) (Oral)   Resp 16   Ht 6' (1.829 m)   Wt 210 lb (95.3 kg)   SpO2 100%   BMI 28.48 kg/m   Visual Acuity Right Eye Distance:   Left Eye Distance:   Bilateral Distance:    Right Eye Near:   Left Eye Near:    Bilateral Near:     Physical Exam Vitals and nursing note reviewed.  Constitutional:      Appearance: Normal appearance.  HENT:     Head: Normocephalic and atraumatic.     Right Ear: Tympanic  membrane and ear canal normal.     Left Ear: Tympanic membrane is injected and retracted.     Nose: Nose normal.  Eyes:     Conjunctiva/sclera: Conjunctivae normal.  Pulmonary:     Effort: Pulmonary effort is normal.  Musculoskeletal:        General: Normal range of motion.     Cervical back: Normal range of motion.  Skin:    General: Skin is warm and dry.  Neurological:     Mental Status: He is alert.  Psychiatric:        Mood and Affect: Mood normal.      UC Treatments / Results  Labs (all labs ordered are listed, but only abnormal results are displayed) Labs Reviewed  SARS CORONAVIRUS 2 (TAT 6-24 HRS)    EKG   Radiology No results found.  Procedures Procedures (including critical care time)  Medications Ordered in UC Medications - No data to display  Initial Impression / Assessment and Plan / UC Course  I have reviewed the triage vital signs and the nursing notes.  Pertinent labs & imaging results that were available during my care of the patient were reviewed by me and considered in my medical decision making (see chart for details).     Left otitis media with associated viral URI. Treating  for ear infection with amoxicillin. Recommended Flonase and Zyrtec for nasal congestion, rhinorrhea and postnasal drip. Follow up as needed for continued or worsening symptoms  Final Clinical Impressions(s) / UC Diagnoses   Final diagnoses:  Left otitis media, unspecified otitis media type  Viral URI     Discharge Instructions     Treating you for an ear infection Take the antibiotic as prescribed Ibuprofen for pain as needed.  Flonase and zyrtec.  Follow up as needed for continued or worsening symptoms     ED Prescriptions    Medication Sig Dispense Auth. Provider   amoxicillin (AMOXIL) 500 MG capsule Take 2 capsules (1,000 mg total) by mouth 3 (three) times daily for 5 days. 30 capsule Josaphine Shimamoto A, NP     PDMP not reviewed this encounter.   Orvan July, NP 12/03/19 1444

## 2019-12-04 LAB — SARS CORONAVIRUS 2 (TAT 6-24 HRS): SARS Coronavirus 2: NEGATIVE

## 2019-12-15 ENCOUNTER — Ambulatory Visit (HOSPITAL_COMMUNITY)
Admission: EM | Admit: 2019-12-15 | Discharge: 2019-12-15 | Disposition: A | Discharge status: Home / Self Care | Payer: BC Managed Care – PPO | Attending: Physician Assistant | Admitting: Physician Assistant

## 2019-12-15 ENCOUNTER — Encounter (HOSPITAL_COMMUNITY): Payer: Self-pay | Admitting: Emergency Medicine

## 2019-12-15 ENCOUNTER — Other Ambulatory Visit: Payer: Self-pay

## 2019-12-15 DIAGNOSIS — H6502 Acute serous otitis media, left ear: Secondary | ICD-10-CM

## 2019-12-15 DIAGNOSIS — H6982 Other specified disorders of Eustachian tube, left ear: Secondary | ICD-10-CM | POA: Diagnosis not present

## 2019-12-15 MED ORDER — FLUTICASONE PROPIONATE 50 MCG/ACT NA SUSP: 1.0000 | Freq: Every day | NASAL | 0 refills | Status: DC

## 2019-12-15 NOTE — ED Triage Notes (Signed)
Left ear pain.  Patient was seen 6/17.  No improvement since that visit at [facility]

## 2019-12-15 NOTE — ED Provider Notes (Signed)
[facility]    CSN: [id number] Arrival date & time: 12/15/19  9024      History   Chief Complaint Chief Complaint  Patient presents with  . Otalgia    HPI Bryan Willis is a 44 y.o. male.   Patient returns for her evaluation of left ear pain.  He reports continued low start Flonase.  He has been compliant ear fullness and discomfort.  He reports he was seen on 12/03/2019 for similar.  He was placed on amoxicillin and Flonase.  Completed amoxicillin without any improvement.  He has been using the Flonase.  Pain is not worse however is not better.  He does report some diminished hearing and feeling pressure in the ear.  Pain is not overly bothersome.  He has not had fever or chills.  He is continue to have a low level nasal congestion.  Does have a history of eustachian tube dysfunction on the right ear and has seen ENT in the past.     Past Medical History:  Diagnosis Date  . Plantar fasciitis     There are no problems to display for this patient.   Past Surgical History:  Procedure Laterality Date  . KNEE ARTHROSCOPY    . VASECTOMY  12/2016       Home Medications    Prior to Admission medications   Medication Sig Start Date End Date Taking? Authorizing Provider  cetirizine (ZYRTEC) 10 MG tablet Take 10 mg by mouth daily.   Yes [provider]  fluticasone (FLONASE) 50 MCG/ACT nasal spray Place 1 spray into both nostrils daily. 12/15/19   Zaydah Nawabi, Veryl Speak, PA-C    Family History Family History  Problem Relation Age of Onset  . Colon cancer Father   . Diabetes Father     Social History Social History   Tobacco Use  . Smoking status: Never Smoker  . Smokeless tobacco: Never Used  Vaping Use  . Vaping Use: Never used  Substance Use Topics  . Alcohol use: Yes    Comment: occ  . Drug use: No     Allergies   Bactrim [sulfamethoxazole-trimethoprim]   Review of Systems Review of Systems   Physical Exam Triage Vital Signs ED  Triage Vitals  Enc Vitals Group     BP 12/15/19 0827 127/78     Pulse Rate 12/15/19 0827 (!) 56     Resp 12/15/19 0827 16     Temp 12/15/19 0827 97.7 F (36.5 C)     Temp Source 12/15/19 0827 Oral     SpO2 12/15/19 0827 98 %     Weight --      Height --      Head Circumference --      Peak Flow --      Pain Score 12/15/19 0840 2     Pain Loc --      Pain Edu? --      Excl. in [place]? --    No data found.  Updated Vital Signs BP 127/78 (BP Location: Left Arm)   Pulse (!) 56   Temp 97.7 F (36.5 C) (Oral)   Resp 16   SpO2 98%   Visual Acuity Right Eye Distance:   Left Eye Distance:   Bilateral Distance:    Right Eye Near:   Left Eye Near:    Bilateral Near:     Physical Exam Vitals and nursing note reviewed.  Constitutional:      Appearance: He is well-developed.  HENT:     Head: Normocephalic and atraumatic.     Right Ear: Tympanic membrane normal.     Ears:     Comments: Left tympanic membrane gray.  Nonbulging nonerythematous.  There is serous fluid present.  No pain with manipulation of the tragus or auricle.  Canal clear without sign of infection  No left-sided preauricular postauricular lymphadenopathy.    Nose: Congestion present.     Mouth/Throat:     Mouth: Mucous membranes are moist.     Pharynx: Oropharynx is clear.  Eyes:     Conjunctiva/sclera: Conjunctivae normal.  Cardiovascular:     Rate and Rhythm: Normal rate.  Pulmonary:     Effort: Pulmonary effort is normal. No respiratory distress.  Musculoskeletal:     Cervical back: Neck supple.  Skin:    General: Skin is warm and dry.  Neurological:     Mental Status: He is alert.      UC Treatments / Results  Labs (all labs ordered are listed, but only abnormal results are displayed) Labs Reviewed - No data to display  EKG   Radiology No results found.  Procedures Procedures (including critical care time)  Medications Ordered in UC Medications - No data to display  Initial  Impression / Assessment and Plan / UC Course  I have reviewed the triage vital signs and the nursing notes.  Pertinent labs & imaging results that were available during my care of the patient were reviewed by me and considered in my medical decision making (see chart for details).     #Eustachian tube dysfunction #Serous otitis Patient is a 44 year old presenting with eustachian tube dysfunction and serous otitis media of the left ear.  Given he had 0 response to amoxicillin I doubt infectious.  Most likely this is eustachian tube dysfunction.  I discussed that this can take time to resolve despite adequate treatments.  Recommended increasing Flonase to twice a day for a week and then continue it once daily.  Recommend continue Zyrtec.  Discussed adding pseudoephedrine for the next 2 to 3 days and use of ibuprofen for discomfort.  Recommended close follow-up with his primary care for monitoring symptoms and possible ENT appointment.  Patient is an established patient with an ENT and did offer he felt the improvement that this may be a viable option as well.  Patient verbalized understanding agreement the plan. Final Clinical Impressions(s) / UC Diagnoses   Final diagnoses:  Eustachian tube dysfunction, left  Acute serous otitis media of left ear, recurrence not specified     Discharge Instructions     Increase flonase to 2 times daily for 1 week Continue zyrtec daily Consider over the counter pseudophedrine per package labeling for 2-3 days  Take 2 regular strength ibuprofen every 6 hours for discomfort  Schedule follow up with your primary care in about 1 week for re-evaluation and possible ENT referral      ED Prescriptions    Medication Sig Dispense Auth. Provider   fluticasone (FLONASE) 50 MCG/ACT nasal spray Place 1 spray into both nostrils daily. 11.1 mL Kynedi Profitt, Veryl Speak, PA-C     PDMP not reviewed this encounter.   Hermelinda Medicus, PA-C 12/15/19 1013

## 2019-12-15 NOTE — Discharge Instructions (Signed)
Increase flonase to 2 times daily for 1 week Continue zyrtec daily Consider over the counter pseudophedrine per package labeling for 2-3 days  Take 2 regular strength ibuprofen every 6 hours for discomfort  Schedule follow up with your primary care in about 1 week for re-evaluation and possible ENT referral

## 2020-01-11 DIAGNOSIS — H9201 Otalgia, right ear: Secondary | ICD-10-CM | POA: Diagnosis not present

## 2020-01-11 DIAGNOSIS — H6982 Other specified disorders of Eustachian tube, left ear: Secondary | ICD-10-CM | POA: Diagnosis not present

## 2020-08-19 DIAGNOSIS — D485 Neoplasm of uncertain behavior of skin: Secondary | ICD-10-CM | POA: Diagnosis not present

## 2020-08-19 DIAGNOSIS — L814 Other melanin hyperpigmentation: Secondary | ICD-10-CM | POA: Diagnosis not present

## 2020-08-19 DIAGNOSIS — L820 Inflamed seborrheic keratosis: Secondary | ICD-10-CM | POA: Diagnosis not present

## 2020-08-19 DIAGNOSIS — L821 Other seborrheic keratosis: Secondary | ICD-10-CM | POA: Diagnosis not present

## 2020-08-19 DIAGNOSIS — D225 Melanocytic nevi of trunk: Secondary | ICD-10-CM | POA: Diagnosis not present

## 2020-08-19 DIAGNOSIS — D1801 Hemangioma of skin and subcutaneous tissue: Secondary | ICD-10-CM | POA: Diagnosis not present

## 2020-08-31 DIAGNOSIS — M25512 Pain in left shoulder: Secondary | ICD-10-CM | POA: Diagnosis not present

## 2021-06-13 DIAGNOSIS — Z0000 Encounter for general adult medical examination without abnormal findings: Secondary | ICD-10-CM | POA: Diagnosis not present

## 2021-06-13 DIAGNOSIS — Z125 Encounter for screening for malignant neoplasm of prostate: Secondary | ICD-10-CM | POA: Diagnosis not present

## 2021-06-16 DIAGNOSIS — Z0000 Encounter for general adult medical examination without abnormal findings: Secondary | ICD-10-CM | POA: Diagnosis not present

## 2021-06-16 DIAGNOSIS — R7309 Other abnormal glucose: Secondary | ICD-10-CM | POA: Diagnosis not present

## 2021-06-16 DIAGNOSIS — R748 Abnormal levels of other serum enzymes: Secondary | ICD-10-CM | POA: Diagnosis not present

## 2021-06-16 DIAGNOSIS — R6882 Decreased libido: Secondary | ICD-10-CM | POA: Diagnosis not present

## 2021-06-16 DIAGNOSIS — J309 Allergic rhinitis, unspecified: Secondary | ICD-10-CM | POA: Diagnosis not present

## 2021-07-05 ENCOUNTER — Other Ambulatory Visit: Payer: Self-pay | Admitting: Internal Medicine

## 2021-07-05 DIAGNOSIS — R7309 Other abnormal glucose: Secondary | ICD-10-CM

## 2021-07-31 ENCOUNTER — Ambulatory Visit
Admission: RE | Admit: 2021-07-31 | Discharge: 2021-07-31 | Disposition: A | Discharge status: Home / Self Care | Payer: Self-pay | Source: Ambulatory Visit | Attending: Internal Medicine | Admitting: Internal Medicine

## 2021-07-31 DIAGNOSIS — R7309 Other abnormal glucose: Secondary | ICD-10-CM

## 2021-08-04 DIAGNOSIS — R7309 Other abnormal glucose: Secondary | ICD-10-CM | POA: Diagnosis not present

## 2021-08-21 DIAGNOSIS — D2372 Other benign neoplasm of skin of left lower limb, including hip: Secondary | ICD-10-CM | POA: Diagnosis not present

## 2021-08-21 DIAGNOSIS — L570 Actinic keratosis: Secondary | ICD-10-CM | POA: Diagnosis not present

## 2021-08-21 DIAGNOSIS — L814 Other melanin hyperpigmentation: Secondary | ICD-10-CM | POA: Diagnosis not present

## 2021-08-21 DIAGNOSIS — L820 Inflamed seborrheic keratosis: Secondary | ICD-10-CM | POA: Diagnosis not present

## 2021-08-21 DIAGNOSIS — L538 Other specified erythematous conditions: Secondary | ICD-10-CM | POA: Diagnosis not present

## 2021-08-21 DIAGNOSIS — L821 Other seborrheic keratosis: Secondary | ICD-10-CM | POA: Diagnosis not present

## 2021-09-13 DIAGNOSIS — E291 Testicular hypofunction: Secondary | ICD-10-CM | POA: Diagnosis not present

## 2022-01-10 DIAGNOSIS — E291 Testicular hypofunction: Secondary | ICD-10-CM | POA: Diagnosis not present

## 2022-01-16 DIAGNOSIS — E291 Testicular hypofunction: Secondary | ICD-10-CM | POA: Diagnosis not present

## 2022-04-18 ENCOUNTER — Encounter: Payer: Self-pay | Admitting: Gastroenterology

## 2022-06-26 ENCOUNTER — Emergency Department (HOSPITAL_COMMUNITY): Payer: BC Managed Care – PPO

## 2022-06-26 ENCOUNTER — Emergency Department (HOSPITAL_COMMUNITY)
Admission: EM | Admit: 2022-06-26 | Discharge: 2022-06-26 | Disposition: A | Discharge status: Home / Self Care | Payer: BC Managed Care – PPO | Attending: Emergency Medicine | Admitting: Emergency Medicine

## 2022-06-26 ENCOUNTER — Other Ambulatory Visit: Payer: Self-pay

## 2022-06-26 ENCOUNTER — Encounter (HOSPITAL_COMMUNITY): Payer: Self-pay

## 2022-06-26 ENCOUNTER — Other Ambulatory Visit: Payer: Self-pay | Admitting: Internal Medicine

## 2022-06-26 DIAGNOSIS — J9383 Other pneumothorax: Secondary | ICD-10-CM | POA: Diagnosis not present

## 2022-06-26 DIAGNOSIS — J90 Pleural effusion, not elsewhere classified: Secondary | ICD-10-CM | POA: Diagnosis not present

## 2022-06-26 DIAGNOSIS — J9311 Primary spontaneous pneumothorax: Secondary | ICD-10-CM | POA: Diagnosis not present

## 2022-06-26 DIAGNOSIS — J9811 Atelectasis: Secondary | ICD-10-CM | POA: Diagnosis not present

## 2022-06-26 DIAGNOSIS — Z0000 Encounter for general adult medical examination without abnormal findings: Secondary | ICD-10-CM | POA: Diagnosis not present

## 2022-06-26 DIAGNOSIS — J939 Pneumothorax, unspecified: Secondary | ICD-10-CM | POA: Diagnosis not present

## 2022-06-26 DIAGNOSIS — R0602 Shortness of breath: Secondary | ICD-10-CM | POA: Diagnosis not present

## 2022-06-26 DIAGNOSIS — R051 Acute cough: Secondary | ICD-10-CM | POA: Diagnosis not present

## 2022-06-26 LAB — CBC WITH DIFFERENTIAL/PLATELET
Abs Immature Granulocytes: 0.07 10*3/uL (ref 0.00–0.07)
Basophils Absolute: 0 10*3/uL (ref 0.0–0.1)
Basophils Relative: 0 %
Eosinophils Absolute: 0.1 10*3/uL (ref 0.0–0.5)
Eosinophils Relative: 1 %
HCT: 45.7 % (ref 39.0–52.0)
Hemoglobin: 15.3 g/dL (ref 13.0–17.0)
Immature Granulocytes: 1 %
Lymphocytes Relative: 12 %
Lymphs Abs: 1.5 10*3/uL (ref 0.7–4.0)
MCH: 28.6 pg (ref 26.0–34.0)
MCHC: 33.5 g/dL (ref 30.0–36.0)
MCV: 85.4 fL (ref 80.0–100.0)
Monocytes Absolute: 0.7 10*3/uL (ref 0.1–1.0)
Monocytes Relative: 5 %
Neutro Abs: 10.4 10*3/uL — ABNORMAL HIGH (ref 1.7–7.7)
Neutrophils Relative %: 81 %
Platelets: 280 10*3/uL (ref 150–400)
RBC: 5.35 MIL/uL (ref 4.22–5.81)
RDW: 12 % (ref 11.5–15.5)
WBC: 12.7 10*3/uL — ABNORMAL HIGH (ref 4.0–10.5)
nRBC: 0 % (ref 0.0–0.2)

## 2022-06-26 LAB — BASIC METABOLIC PANEL
Anion gap: 14 (ref 5–15)
BUN: 25 mg/dL — ABNORMAL HIGH (ref 6–20)
CO2: 20 mmol/L — ABNORMAL LOW (ref 22–32)
Calcium: 9.2 mg/dL (ref 8.9–10.3)
Chloride: 103 mmol/L (ref 98–111)
Creatinine, Ser: 1.43 mg/dL — ABNORMAL HIGH (ref 0.61–1.24)
GFR, Estimated: >60 mL/min (ref >60)
Glucose, Bld: 138 mg/dL — ABNORMAL HIGH (ref 70–99)
Potassium: 3.5 mmol/L (ref 3.5–5.1)
Sodium: 137 mmol/L (ref 135–145)

## 2022-06-26 MED ORDER — FENTANYL CITRATE PF 50 MCG/ML IJ SOSY
100.0000 ug | PREFILLED_SYRINGE | Freq: Once | INTRAMUSCULAR | Status: AC
  Administered 2022-06-26 (×2): 50 ug via INTRAVENOUS
  Filled 2022-06-26: qty 2

## 2022-06-26 NOTE — ED Notes (Signed)
Placed on 2lpm [place] per MD

## 2022-06-26 NOTE — ED Notes (Signed)
1 cm incision made for chest tube by MD.

## 2022-06-26 NOTE — ED Notes (Signed)
MD numbing site at this time.

## 2022-06-26 NOTE — ED Notes (Signed)
Procedure finished. Bubbling noted. Pt tolerated well

## 2022-06-26 NOTE — ED Notes (Signed)
Consent signed.

## 2022-06-26 NOTE — ED Provider Notes (Signed)
Clinical Course as of 06/26/22 1827  Tue Jun 26, 2022  1552 Stable  Likely DC pending CT.  Patient has been informed of recommendation for admission.  Declined and wants to go home.  [CC]  1827  Per critical care doctor, they are trying to find an appropriate valve for the patient.  Rechecked on the patient, and appropriate valve has been found and patient is stable for discharge per pulmonology. [CC]    Clinical Course User Index [CC] Tretha Sciara, MD      Tretha Sciara, MD 06/26/22 [phone]

## 2022-06-26 NOTE — ED Provider Notes (Signed)
[facility]-EMERGENCY DEPT Provider Note   CSN: [id number] Arrival date & time: 06/26/22  1319     History  Chief Complaint  Patient presents with   Shortness of Breath    Bryan Willis is a 47 y.o. male.   Shortness of Breath Associated symptoms: cough      47 year old male presenting to the emergency department with roughly 3 days of a significant cough.  He states that on Saturday he ingested a lot of water in the shower and began coughing.  He developed shortness of breath.  He denied any chest pain but did endorse a funny feeling in his chest.  He endorsed worsening shortness of breath.  He had an x-ray done outpatient that showed a collapsed left lung.  He denies any other complaints.  Home Medications Prior to Admission medications   Medication Sig Start Date End Date Taking? Authorizing Provider  cetirizine (ZYRTEC) 10 MG tablet Take 10 mg by mouth daily.    [provider]  fluticasone (FLONASE) 50 MCG/ACT nasal spray Place 1 spray into both nostrils daily. 12/15/19   Darr, Gerilyn Pilgrim, PA-C      Allergies    Bactrim [sulfamethoxazole-trimethoprim]    Review of Systems   Review of Systems  Respiratory:  Positive for cough and shortness of breath.   All other systems reviewed and are negative.   Physical Exam Updated Vital Signs BP (!) 164/84   Pulse 99   Temp 97.7 F (36.5 C) (Oral)   Resp (!) 21   Ht 6' (1.829 m)   Wt 97.5 kg   SpO2 95%   BMI 29.16 kg/m  Physical Exam Vitals and nursing note reviewed.  Constitutional:      General: He is not in acute distress.    Appearance: He is well-developed.  HENT:     Head: Normocephalic and atraumatic.  Eyes:     Conjunctiva/sclera: Conjunctivae normal.  Cardiovascular:     Rate and Rhythm: Normal rate and regular rhythm.     Heart sounds: No murmur heard. Pulmonary:     Effort: Pulmonary effort is normal. No respiratory distress.     Breath sounds: Examination of the  left-upper field reveals decreased breath sounds. Examination of the left-middle field reveals decreased breath sounds. Examination of the left-lower field reveals decreased breath sounds. Decreased breath sounds present.     Comments: Absent breath sounds on the left Abdominal:     Palpations: Abdomen is soft.     Tenderness: There is no abdominal tenderness.  Musculoskeletal:        General: No swelling.     Cervical back: Neck supple.  Skin:    General: Skin is warm and dry.     Capillary Refill: Capillary refill takes less than 2 seconds.  Neurological:     Mental Status: He is alert.  Psychiatric:        Mood and Affect: Mood normal.     ED Results / Procedures / Treatments   Labs (all labs ordered are listed, but only abnormal results are displayed) Labs Reviewed  CBC WITH DIFFERENTIAL/PLATELET - Abnormal; Notable for the following components:      Result Value   WBC 12.7 (*)    Neutro Abs 10.4 (*)    All other components within normal limits  BASIC METABOLIC PANEL - Abnormal; Notable for the following components:   CO2 20 (*)    Glucose, Bld 138 (*)    BUN 25 (*)  Creatinine, Ser 1.43 (*)    All other components within normal limits    EKG None  Radiology DG Chest 1 View  Result Date: 06/26/2022 CLINICAL DATA:  Pneumothorax EXAM: CHEST  1 VIEW COMPARISON:  None Available. FINDINGS: Near complete left pneumothorax. No mediastinal shift. No pleural effusion. Right lung well aerated and clear.  Negative for heart failure. IMPRESSION: Near complete left pneumothorax. No mediastinal shift or pleural effusion. These results were called by telephone at the time of interpretation on 06/26/2022 at 2:07 pm to provider Kellie Simmering , who verbally acknowledged these results. Electronically Signed   By: Franchot Gallo M.D.   On: 06/26/2022 14:07    Procedures .Critical Care  Performed by: Regan Lemming, MD Authorized by: Regan Lemming, MD   Critical care provider statement:     Critical care time (minutes):  30   Critical care was time spent personally by me on the following activities:  Development of treatment plan with patient or surrogate, discussions with consultants, evaluation of patient's response to treatment, examination of patient, ordering and review of laboratory studies, ordering and review of radiographic studies, ordering and performing treatments and interventions, pulse oximetry, re-evaluation of patient's condition and review of old charts   Care discussed with: admitting provider       Medications Ordered in ED Medications  fentaNYL (SUBLIMAZE) injection 100 mcg (has no administration in time range)    ED Course/ Medical Decision Making/ A&P                           Medical Decision Making Risk Prescription drug management.     47 year old male presenting to the emergency department with roughly 3 days of a significant cough.  He states that on Saturday he ingested a lot of water in the shower and began coughing.  He developed shortness of breath.  He denied any chest pain but did endorse a funny feeling in his chest.  He endorsed worsening shortness of breath.  He had an x-ray done outpatient that showed a collapsed left lung.  He denies any other complaints.  On arrival, the patient was afebrile, initially tachycardic pulse 120, subsequent improved to mild tachycardia pulse 107, mild tachypnea RR 22, BP 158/120, saturating 97% on room air.  Patient was placed on 2 L O2 for mild tachypnea in the setting of a pneumothorax.  Urgent repeat chest x-ray was performed which revealed a large 100% left-sided pneumothorax with no evidence for tension with no mediastinal shift.  The patient has no tension physiology, is normotensive, overall well-appearing.  His symptoms have been present for several days.  Symptoms are consistent with spontaneous pneumothorax which requires chest tube placement. CXR: IMPRESSION:  Near complete left pneumothorax. No  mediastinal shift or pleural  effusion.    These results were called by telephone at the time of interpretation  on 06/26/2022 at 2:07 pm to provider Kellie Simmering , who verbally  acknowledged these results.     I did consult on-call pulmonology for pigtail catheter placement and Dr. Bjorn Loser will come down to evaluate the patient.  Patient updated on the plan of care.  He may benefit from admission for observation and chest tube management due to the risk for reexpansion pulmonary edema.  Signout given to Dr. Oswald Hillock pending pulmonology final recommendations at 1530.   Final Clinical Impression(s) / ED Diagnoses Final diagnoses:  Spontaneous pneumothorax    Rx / DC Orders ED Discharge Orders  None         Ernie Avena, MD 06/26/22 [phone]

## 2022-06-26 NOTE — ED Notes (Signed)
Time out performed with pulmonologist, RN, and nurse tech

## 2022-06-26 NOTE — ED Triage Notes (Signed)
Patient had an xray done 25 minutes ago and has a collapsed left lung. Saturday night patient was taking a shower, water went in his nose, he started gagging and coughing. Then developed a cough that night.

## 2022-06-26 NOTE — ED Notes (Signed)
Pt ambulatory to ed room 14, patient talking in full sentences, no distress noted

## 2022-06-26 NOTE — Procedures (Signed)
Insertion of Chest Tube Procedure Note  Bryan Willis  [id number]  [birth year]  Date:06/26/22  Time:3:10 PM    Provider Performing: Maryjane Hurter   Procedure: Pleural Catheter Insertion w/o Imaging Guidance [phone])  Indication(s) Pneumothorax  Consent Risks of the procedure as well as the alternatives and risks of each were explained to the patient and/or caregiver.  Consent for the procedure was obtained and is signed in the bedside chart  Anesthesia Topical only with 1% lidocaine    Time Out Verified patient identification, verified procedure, site/side was marked, verified correct patient position, special equipment/implants available, medications/allergies/relevant history reviewed, required imaging and test results available.   Sterile Technique Maximal sterile technique including full sterile barrier drape, hand hygiene, sterile gown, sterile gloves, mask, hair covering, sterile ultrasound probe cover (if used).   Procedure Description Ultrasound not used to identify appropriate pleural anatomy for placement and overlying skin marked. Area of placement cleaned and draped in sterile fashion.  A 14 French pigtail pleural catheter was placed into the left pleural space using Seldinger technique. Appropriate return of air was obtained.  The tube was connected to atrium and placed on -20 cm H2O wall suction.   Complications/Tolerance None; patient tolerated the procedure well. Chest X-ray is ordered to verify placement.   EBL Minimal  Specimen(s) none

## 2022-06-26 NOTE — Consult Note (Signed)
NAME:  Bryan Willis, MRN:  [id number], DOB:  Jan 09, 1976, LOS: 0 ADMISSION DATE:  06/26/2022, CONSULTATION DATE:  06/26/22 REFERRING MD:  Karene Fry, CHIEF COMPLAINT:  cough   History of Present Illness:  47yM no PMH who presents with acute cough over 3 days after removing water pressure regulator to shower head. Took shower and took blast of shower water into his mouth and then had powerful coughs. Ever since then had cough and pleuritic CP.  No family history of PTX.   No smoking, vaping, MJ.  Pertinent  Medical History  none  Significant Hospital Events: Including procedures, antibiotic start and stop dates in addition to other pertinent events   1/9 pigtail chest tube placed   Interim History / Subjective:    Objective   Blood pressure (!) 144/91, pulse 81, temperature 98.5 F (36.9 C), resp. rate 20, height 6' (1.829 m), weight 97.5 kg, SpO2 100 %.       No intake or output data in the 24 hours ending 06/26/22 1844 Filed Weights   06/26/22 1330  Weight: 97.5 kg    Examination: General appearance: 47 y.o., male, NAD, conversant, male, NAD, conversant  Eyes: anicteric sclerae; PERRL, tracking appropriately HENT: NCAT; MMM Neck: Trachea midline; no lymphadenopathy, no JVD Lungs: breath sounds absent on left, with normal respiratory effort CV: RRR, no murmur  Abdomen: Soft, non-tender; non-distended, BS present  Extremities: No peripheral edema, warm Skin: Normal turgor and texture; no rash Psych: Appropriate affect Neuro: Alert and oriented to person and place, no focal deficit    Resolved Hospital Problem list    Assessment & Plan:  # Primary spontaneous vs traumatic large left PTX  - pigtail to heimlich valve - CXR in clinic tomorrow - possible removal if lung remains completely expanded vs clamping trial - no flights for 2 weeks post chest tube removal - no scuba ever unless he eventually gets pleurodesis - no contact sports for a year  - avoid strenuous exertion for a couple  weeks post chest tube removal then let symptoms be guide  Best Practice (right click and "Reselect all SmartList Selections" daily)  Per [facility]  Labs   CBC: Recent Labs  Lab 06/26/22 1339  WBC 12.7*  NEUTROABS 10.4*  HGB 15.3  HCT 45.7  MCV 85.4  PLT 280    Basic Metabolic Panel: Recent Labs  Lab 06/26/22 1339  NA 137  K 3.5  CL 103  CO2 20*  GLUCOSE 138*  BUN 25*  CREATININE 1.43*  CALCIUM 9.2   GFR: Estimated Creatinine Clearance: 77.3 mL/min (A) (by C-G formula based on SCr of 1.43 mg/dL (H)). Recent Labs  Lab 06/26/22 1339  WBC 12.7*    Liver Function Tests: No results for input(s): "AST", "ALT", "ALKPHOS", "BILITOT", "PROT", "ALBUMIN" in the last 168 hours. No results for input(s): "LIPASE", "AMYLASE" in the last 168 hours. No results for input(s): "AMMONIA" in the last 168 hours.  ABG No results found for: "PHART", "PCO2ART", "PO2ART", "HCO3", "TCO2", "ACIDBASEDEF", "O2SAT"   Coagulation Profile: No results for input(s): "INR", "PROTIME" in the last 168 hours.  Cardiac Enzymes: No results for input(s): "CKTOTAL", "CKMB", "CKMBINDEX", "TROPONINI" in the last 168 hours.  HbA1C: No results found for: "HGBA1C"  CBG: No results for input(s): "GLUCAP" in the last 168 hours.  Review of Systems:   12 po[place] of systems negative except as in hpi  Past Medical History:  He,  has a past medical history of Plantar fasciitis.   Surgical History:  Past Surgical History:  Procedure Laterality Date   KNEE ARTHROSCOPY     VASECTOMY  12/2016     Social History:   reports that he has never smoked. He has never used smokeless tobacco. He reports current alcohol use. He reports that he does not use drugs.   Family History:  His family history includes Colon cancer in his father; Diabetes in his father.   Allergies Allergies  Allergen Reactions   Bactrim [Sulfamethoxazole-Trimethoprim] Other (See Comments)    Pt stated, "Gets the shakes; feels  like has the flu and gets redness of the skin"     Home Medications  Prior to Admission medications   Medication Sig Start Date End Date Taking? Authorizing Provider  cetirizine (ZYRTEC) 10 MG tablet Take 10 mg by mouth daily.    [provider]  fluticasone (FLONASE) 50 MCG/ACT nasal spray Place 1 spray into both nostrils daily. 12/15/19   Darr, Edison Nasuti, PA-C     Critical care time: na

## 2022-06-26 NOTE — ED Provider Triage Note (Signed)
Emergency Medicine Provider Triage Evaluation Note  Florian Chauca III , a 47 y.o. male  was evaluated in triage.  Pt complains of shortness of breath.  Patient seen by PCP prior to arrival and sent to the ED due to pneumothorax.  Patient states MD told him he may need a chest tube.  Patient states on Saturday he ingested a lot of water in the shower and began coughing.  He admits to shortness of breath.  No previous history of pneumothorax.  Chest wall injury.  Review of Systems  Positive: SOB Negative: fever  Physical Exam  BP (!) 158/120 (BP Location: Left Arm)   Pulse (!) 120   Temp 98.3 F (36.8 C) (Oral)   Resp (!) 22   Ht 6' (1.829 m)   Wt 97.5 kg   SpO2 97%   BMI 29.16 kg/m  Gen:   Awake, no distress   Resp:  Normal effort  MSK:   Moves extremities without difficulty  Other:    Medical Decision Making  Medically screening exam initiated at 1:38 PM.  Appropriate orders placed.  Stanford Scotland III was informed that the remainder of the evaluation will be completed by another provider, this initial triage assessment does not replace that evaluation, and the importance of remaining in the ED until their evaluation is complete.  Routine labs CT tech working on getting images, if unable to get them, will need repeat x-ray   Suzy Bouchard, PA-C 06/26/22 1340

## 2022-06-27 ENCOUNTER — Telehealth: Payer: Self-pay | Admitting: Internal Medicine

## 2022-06-27 ENCOUNTER — Encounter: Payer: Self-pay | Admitting: Internal Medicine

## 2022-06-27 ENCOUNTER — Ambulatory Visit: Payer: BC Managed Care – PPO | Admitting: Internal Medicine

## 2022-06-27 ENCOUNTER — Ambulatory Visit (INDEPENDENT_AMBULATORY_CARE_PROVIDER_SITE_OTHER): Payer: BC Managed Care – PPO

## 2022-06-27 ENCOUNTER — Ambulatory Visit (INDEPENDENT_AMBULATORY_CARE_PROVIDER_SITE_OTHER): Payer: BC Managed Care – PPO | Admitting: Internal Medicine

## 2022-06-27 VITALS — BP 124/90 | HR 81 | Temp 98.1°F | Ht 72.0 in | Wt 213.8 lb

## 2022-06-27 DIAGNOSIS — J939 Pneumothorax, unspecified: Secondary | ICD-10-CM | POA: Diagnosis not present

## 2022-06-27 DIAGNOSIS — J9383 Other pneumothorax: Secondary | ICD-10-CM

## 2022-06-27 MED ORDER — CEFDINIR 300 MG PO CAPS: 300.0000 mg | ORAL_CAPSULE | Freq: Two times a day (BID) | ORAL | 0 refills | Status: DC

## 2022-06-27 NOTE — Patient Instructions (Addendum)
Take delsym two tsp every 12 hours and supplement if needed with oxyir 5 mg  every 4 hours to suppress the urge to cough. Swallowing water and/or using ice chips/non mint and menthol containing candies (such as lifesavers or sugarless jolly ranchers) are also effective.  You should rest your voice and avoid activities that you know make you cough.  Once you have eliminated the cough for 3 straight days try reducing the oxyir first,  then the delsym as tolerated.   Return Friday morning  830 am for chest tube removal  - take oxyIR an hour prior

## 2022-06-27 NOTE — Assessment & Plan Note (Signed)
Occurred p severe coughing fits 06/23/22 > L Chest tube 06/26/22 with immediate relief of cough   No obvious air leak on exam or residual ptx on cxr but I have several concerns:  1) pattern of chronic throat clearing then severe coughing fits with colds suggests UACS  Rec eliminate cycle with delsym/oxyir and hard rock candy with GERD rx next then gabapentin if persists  2)  omnicef 300 mg bid x 7 d for ? Asp pneumonia LLL post basal segment  3) return in 48 h for removal of chest tube if still no leak          Each maintenance medication was reviewed in detail including emphasizing most importantly the difference between maintenance and prns and under what circumstances the prns are to be triggered using an action plan format where appropriate.  Total time for H and P, chart review, counseling,   and generating customized AVS unique to this office visit / same day charting  > 40 min new pt to me/ post ER eval

## 2022-06-27 NOTE — Telephone Encounter (Signed)
Spoke with the pt and notified of response per Dr Wert He verbalized understanding  Nothing further needed 

## 2022-06-27 NOTE — Telephone Encounter (Signed)
Called pt and there was no answer-LMTCB °

## 2022-06-27 NOTE — Progress Notes (Signed)
Bryan Willis, male    DOB: [birth year]   MRN: [id number]   Brief patient profile:  72  yowm never smoker  referred to pulmonary clinic 06/27/2022 by Dr Bryan Willis  for f/u L PTX    - has h/o chronic cough x years per wife (mostly throat clearing and severe cough with URI's )  06/26/22 ER eval: [name] no PMH who presents with acute cough over 3 days after removing water pressure regulator to shower head. Took shower and took blast of shower water into his mouth and then had powerful coughs. Ever since then had cough and pleuritic CP.   Rec ER 06/26/22 # Primary spontaneous vs traumatic large left PTX  - pigtail to heimlich valve - CXR in clinic 1/10  - possible removal if lung remains completely expanded vs clamping trial - no flights for 2 weeks post chest tube removal - no scuba ever unless he eventually gets pleurodesis - no contact sports for a year  - avoid strenuous exertion for a couple weeks post chest tube removal then let symptoms be guide   History of Present Illness  06/27/2022  Pulmonary/ 1st office eval/Bryan Willis  Chief Complaint  Patient presents with   Hospitalization [facility] f/u collapsed left lung.  Possible get chest tube out today  Had a cough attributed to flu with tamiful "usual cough" which had improved and flew to [facility] 12/26 and back on 06/17/23 and fine  1/6 with no pressure reducer on shower head  and shot of water made him cough "worse ever" rx with dex by [facility] and cough no better > ER [facility] as above  Dyspnea: none   Cough: much better but still clearing throat Sleep: no problem flat SABA use: none  No obvious day to day or daytime pattern/variability or assoc excess/ purulent sputum or mucus plugs or hemoptysis or cp or chest tightness, subjective wheeze or overt sinus or hb symptoms.   Sleeping  without nocturnal  or early am exacerbation  of respiratory  c/o's or need for noct saba. Also denies any obvious fluctuation of symptoms with weather or  environmental changes or other aggravating or alleviating factors except as outlined above   No unusual exposure hx or h/o childhood pna/ asthma or knowledge of premature birth.  Current Allergies, Complete Past Medical History, Past Surgical History, Family History, and Social History were reviewed in [employer] record.  ROS  The following are not active complaints unless bolded Hoarseness, sore throat, dysphagia, dental problems, itching, sneezing,  nasal congestion or discharge of excess mucus or purulent secretions, ear ache,   fever, chills, sweats, unintended wt loss or wt gain, classically pleuritic or exertional cp,  orthopnea pnd or arm/hand swelling  or leg swelling, presyncope, palpitations, abdominal pain, anorexia, nausea, vomiting, diarrhea  or change in bowel habits or change in bladder habits, change in stools or change in urine, dysuria, hematuria,  rash, arthralgias, visual complaints, headache, numbness, weakness or ataxia or problems with walking or coordination,  change in mood or  memory.           Past Medical History:  Diagnosis Date   Plantar fasciitis     Outpatient Medications Prior to Visit  Medication Sig Dispense Refill   cetirizine (ZYRTEC) 10 MG tablet Take 10 mg by mouth daily as needed for allergies or rhinitis.     fluticasone (FLONASE) 50 MCG/ACT nasal spray Place 1 spray into both nostrils daily. (Patient taking differently:  Place 1 spray into both nostrils daily as needed.) 11.1 mL 0   ibuprofen (ADVIL) 200 MG tablet Take 400 mg by mouth every 6 (six) hours as needed.     oxycodone (OXY-IR) 5 MG capsule Take 5 mg by mouth every 4 (four) hours as needed for pain.     dexamethasone (DECADRON) 4 MG tablet 1 tablet Orally Once a day (Patient not taking: Reported on 06/27/2022)     No facility-administered medications prior to visit.     Objective:     BP (!) 124/90 (BP Location: Left Arm, Patient Position: Sitting, Cuff Size:  Normal)   Pulse 81   Temp 98.1 F (36.7 C) (Oral)   Ht 6' (1.829 m)   Wt 213 lb 12.8 oz (97 kg)   SpO2 98%   BMI 29.00 kg/m   SpO2: 98 %  Amb wm nad    HEENT : Oropharynx  clear    NECK :  without  apparent JVD/ palpable Nodes/TM    LUNGS: no acc muscle use,  Nl contour chest which is clear to A and P bilaterally without cough on insp or exp maneuvers - L pigtail chest tube to Heimlich, no obvious air leak with cough    CV:  RRR  no s3 or murmur or increase in P2, and no edema   ABD:  soft and nontender with nl inspiratory excursion in the supine position. No bruits or organomegaly appreciated   MS:  Nl gait/ ext warm without deformities Or obvious joint restrictions  calf tenderness, cyanosis or clubbing    SKIN: warm and dry without lesions    NEURO:  alert, approp, nl sensorium with  no motor or cerebellar deficits apparent.   CXR PA and Lateral:   06/27/2022 :    I personally reviewed images and impression is as follows:     No residual ptx ? Infiltrate postero basal segment  LLL       Assessment   Spontaneous pneumothorax Occurred p severe coughing fits 06/23/22 > L Chest tube 06/26/22 with immediate relief of cough   No obvious air leak on exam or residual ptx on cxr but I have several concerns:  1) pattern of chronic throat clearing then severe coughing fits with colds suggests UACS  Rec eliminate cycle with delsym/oxyir and hard rock candy with GERD rx next then gabapentin if persists  2)  omnicef 300 mg bid x 7 d for ? Asp pneumonia LLL post basal segment  3) return in 48 h for removal of chest tube if still no leak          Each maintenance medication was reviewed in detail including emphasizing most importantly the difference between maintenance and prns and under what circumstances the prns are to be triggered using an action plan format where appropriate.  Total time for H and P, chart review, counseling,   and generating customized AVS unique to this  office visit / same day charting  > 40 min new pt to me/ post ER eval            Christinia Gully, MD 06/27/2022

## 2022-06-27 NOTE — Telephone Encounter (Signed)
Forgot to tell him to pick up omnicef 300 mg bid as he may have a small area of aspiration pneumonia L lower lobe

## 2022-06-29 ENCOUNTER — Ambulatory Visit (INDEPENDENT_AMBULATORY_CARE_PROVIDER_SITE_OTHER): Payer: BC Managed Care – PPO | Admitting: Internal Medicine

## 2022-06-29 ENCOUNTER — Encounter: Payer: Self-pay | Admitting: Internal Medicine

## 2022-06-29 ENCOUNTER — Ambulatory Visit (INDEPENDENT_AMBULATORY_CARE_PROVIDER_SITE_OTHER)
Admission: RE | Admit: 2022-06-29 | Discharge: 2022-06-29 | Disposition: A | Discharge status: Home / Self Care | Payer: BC Managed Care – PPO | Source: Ambulatory Visit | Attending: Internal Medicine | Admitting: Internal Medicine

## 2022-06-29 VITALS — BP 144/88 | HR 79 | Temp 98.2°F | Wt 213.8 lb

## 2022-06-29 DIAGNOSIS — J9383 Other pneumothorax: Secondary | ICD-10-CM

## 2022-06-29 DIAGNOSIS — Z4682 Encounter for fitting and adjustment of non-vascular catheter: Secondary | ICD-10-CM | POA: Diagnosis not present

## 2022-06-29 NOTE — Patient Instructions (Addendum)
Please remember to go to the  x-ray department  for your tests -  - [address]  directly across from [facility] - go to basement   - call me at [phone] when you are done and before you leave  Keep wound clear and dry  and replace with bandaid on Monday Jan 15   Follow up with Dr Verlee Monte next available or 2-3 weeks

## 2022-06-29 NOTE — Assessment & Plan Note (Signed)
Occurred p severe coughing fits 06/23/22  -  L Chest tube 06/26/22 with immediate relief of cough  - removed 06/29/2022 and neosporin ointment applied to wound then covered with gauze pad - No obvious PTX on film w/in an hour of removal  Rec  Finish omnicef 300 mg bid until 7 d used up  Keep wound clean and dry, remove dressing am 1/15 and cut suture and apply bandaid to wound No rough activities/ keep arms down and avoid cough or anything requiring deep or forceful breathing  If worse cough/ sob > back to ER   F/u Dr Verlee Monte next available.         Each maintenance medication was reviewed in detail including emphasizing most importantly the difference between maintenance and prns and under what circumstances the prns are to be triggered using an action plan format where appropriate.  Total time for H and P, chart review, counseling,   and generating customized AVS unique to this office visit / same day charting = 25 min

## 2022-06-29 NOTE — Progress Notes (Unsigned)
Bryan Willis, male    DOB: June 18, 1976   MRN: [id number]   Brief patient profile:  47  yowm never smoker  referred to pulmonary clinic 06/27/2022 by Dr [place]  for f/u L PTX    - has h/o chronic cough x years per wife (mostly throat clearing and severe cough with URI's ).  06/26/22 ER eval: [name] no PMH who presents with acute cough over 3 days after removing water pressure regulator to shower head. Took shower and took blast of shower water into his mouth and then had powerful coughs. Ever since then had cough and pleuritic CP.    Rec ER 06/26/22 # Primary spontaneous vs traumatic large left PTX  - pigtail to heimlich valve - CXR in clinic 1/10  - possible removal if lung remains completely expanded vs clamping trial - no flights for 2 weeks post chest tube removal - no scuba ever unless he eventually gets pleurodesis - no contact sports for a year  - avoid strenuous exertion for a couple weeks post chest tube removal then let symptoms be guide   History of Present Illness  06/27/2022  Pulmonary/ 1st office eval/Bryan [place]  Chief Complaint  Patient presents with   Hospitalization [place] f/u collapsed left lung.  Possible get chest tube out today  Had a cough attributed to flu with tamiful "usual cough" which had improved and flew to [place]26 and back on 06/17/23 and fine  1/6 with no pressure reducer on shower head  and shot of water made him cough "worse ever" rx with dex by [facility] and cough no better > ER [facility] as above  Dyspnea: none   Cough: much better but still clearing throat Sleep: no problem flat SABA use: none Rec Take delsym two tsp every 12 hours and supplement if needed with oxyir 5 mg  every 4 hours to suppress the urge to cough. Swallowing water and/or using ice chips/non mint and menthol containing candies (such as lifesavers or sugarless jolly ranchers) are also effective.  You should rest your voice and avoid activities that you know make you cough. Once  you have eliminated the cough for 3 straight days try reducing the oxyir first,  then the delsym as tolerated.  Omnicef 300 mg bid x 7 d Return 06/1221 for chest tube removal  - take oxyIR an hour prior    06/29/2022  f/u ov/Bryan Willis re: L PTX   maint on no rx   Chief Complaint  Patient presents with   Follow-up    chest tube removal  Dyspnea:  none  Cough: minimal  No cp      No obvious day to day or daytime variability or assoc excess/ purulent sputum or mucus plugs or hemoptysis or  chest tightness, subjective wheeze or overt sinus or hb symptoms.   Sleeping  without nocturnal  or early am exacerbation  of respiratory  c/o's or need for noct saba. Also denies any obvious fluctuation of symptoms with weather or environmental changes or other aggravating or alleviating factors except as outlined above   No unusual exposure hx or h/o childhood pna/ asthma or knowledge of premature birth.  Current Allergies, Complete Past Medical History, Past Surgical History, Family History, and Social History were reviewed in [employer] record.  ROS  The following are not active complaints unless bolded Hoarseness, sore throat, dysphagia, dental problems, itching, sneezing,  nasal congestion or discharge of excess mucus or purulent secretions, ear  ache,   fever, chills, sweats, unintended wt loss or wt gain, classically pleuritic or exertional cp,  orthopnea pnd or arm/hand swelling  or leg swelling, presyncope, palpitations, abdominal pain, anorexia, nausea, vomiting, diarrhea  or change in bowel habits or change in bladder habits, change in stools or change in urine, dysuria, hematuria,  rash, arthralgias, visual complaints, headache, numbness, weakness or ataxia or problems with walking or coordination,  change in mood or  memory.        Current Meds  Medication Sig   cefdinir (OMNICEF) 300 MG capsule Take 1 capsule (300 mg total) by mouth 2 (two) times daily.   cetirizine  (ZYRTEC) 10 MG tablet Take 10 mg by mouth daily as needed for allergies or rhinitis.   ibuprofen (ADVIL) 200 MG tablet Take 400 mg by mouth every 6 (six) hours as needed.   oxyCODONE (OXY IR/ROXICODONE) 5 MG immediate release tablet Take 5 mg by mouth 4 (four) times daily as needed.                  Past Medical History:  Diagnosis Date   Plantar fasciitis        Objective:     Wt Readings from Last 3 Encounters:  06/29/22 213 lb 12.8 oz (97 kg)  06/27/22 213 lb 12.8 oz (97 kg)  06/26/22 215 lb (97.5 kg)      Vital signs reviewed  06/29/2022  - Note at rest 02 sats  99% on RA   General appearance:    pleasant wm and  Lung clear bilaterally, good bs and L ct does not bubble when placed under water seal and pt coughed             Assessment

## 2022-06-30 ENCOUNTER — Encounter: Payer: Self-pay | Admitting: Internal Medicine

## 2022-07-02 DIAGNOSIS — K5900 Constipation, unspecified: Secondary | ICD-10-CM | POA: Diagnosis not present

## 2022-07-10 NOTE — Progress Notes (Deleted)
Synopsis: Referred for pneumothorax by Deland Pretty, MD  Subjective:   PATIENT ID: Bryan Willis GENDER: male DOB: 09/14/1975, MRN: [id number]  No chief complaint on file.  35yM with history of primary spontaneous vs traumatic pneumothorax 06/26/22 s/p pigtail chest tube.   Chest tube removed 1/12 and follow up CXR without PTX.  Otherwise pertinent review of systems is negative.  Past Medical History:  Diagnosis Date   Plantar fasciitis      Family History  Problem Relation Age of Onset   Colon cancer Father    Diabetes Father      Past Surgical History:  Procedure Laterality Date   KNEE ARTHROSCOPY     VASECTOMY  12/2016    Social History   Socioeconomic History   Marital status: Married    Spouse name: Not on file   Number of children: 3   Years of education: Not on file   Highest education level: Not on file  Occupational History   Occupation: Scientific laboratory technician  Tobacco Use   Smoking status: Never   Smokeless tobacco: Never  Vaping Use   Vaping Use: Never used  Substance and Sexual Activity   Alcohol use: Yes    Comment: occ   Drug use: No   Sexual activity: Yes    Partners: Female  Other Topics Concern   Not on file  Social History Narrative   Not on file   Social Determinants of Health   Financial Resource Strain: Not on file  Food Insecurity: Not on file  Transportation Needs: Not on file  Physical Activity: Not on file  Stress: Not on file  Social Connections: Not on file  Intimate Partner Violence: Not on file     Allergies  Allergen Reactions   Bactrim [Sulfamethoxazole-Trimethoprim] Other (See Comments)    Pt stated, "Gets the shakes; feels like has the flu and gets redness of the skin"     Outpatient Medications Prior to Visit  Medication Sig Dispense Refill   cefdinir (OMNICEF) 300 MG capsule Take 1 capsule (300 mg total) by mouth 2 (two) times daily. 14 capsule 0   cetirizine (ZYRTEC) 10 MG tablet Take 10 mg by mouth daily  as needed for allergies or rhinitis.     ibuprofen (ADVIL) 200 MG tablet Take 400 mg by mouth every 6 (six) hours as needed.     oxyCODONE (OXY IR/ROXICODONE) 5 MG immediate release tablet Take 5 mg by mouth 4 (four) times daily as needed.     No facility-administered medications prior to visit.       Objective:   Physical Exam:  General appearance: 47 y.o., male, NAD, conversant  Eyes: anicteric sclerae; PERRL, tracking appropriately HENT: NCAT; MMM Neck: Trachea midline; no lymphadenopathy, no JVD Lungs: CTAB, no crackles, no wheeze, with normal respiratory effort CV: RRR, no murmur  Abdomen: Soft, non-tender; non-distended, BS present  Extremities: No peripheral edema, warm Skin: Normal turgor and texture; no rash Psych: Appropriate affect Neuro: Alert and oriented to person and place, no focal deficit     There were no vitals filed for this visit.   on *** LPM *** RA BMI Readings from Last 3 Encounters:  06/29/22 29.00 kg/m  06/27/22 29.00 kg/m  06/26/22 29.16 kg/m   Wt Readings from Last 3 Encounters:  06/29/22 213 lb 12.8 oz (97 kg)  06/27/22 213 lb 12.8 oz (97 kg)  06/26/22 215 lb (97.5 kg)     CBC    Component Value Date/Time  WBC 12.7 (H) 06/26/2022 1339   RBC 5.35 06/26/2022 1339   HGB 15.3 06/26/2022 1339   HCT 45.7 06/26/2022 1339   PLT 280 06/26/2022 1339   MCV 85.4 06/26/2022 1339   MCH 28.6 06/26/2022 1339   MCHC 33.5 06/26/2022 1339   RDW 12.0 06/26/2022 1339   LYMPHSABS 1.5 06/26/2022 1339   MONOABS 0.7 06/26/2022 1339   EOSABS 0.1 06/26/2022 1339   BASOSABS 0.0 06/26/2022 1339    ***  Chest Imaging:  CXR 07/01/22 stable  Pulmonary Functions Testing Results:     No data to display             Assessment & Plan:    Plan: # Primary spontaneous vs traumatic large left PTX treated with pigtail chest tube - no flights for 2 weeks post chest tube removal - no [place] ever unless he eventually gets pleurodesis - no contact  sports for a year  - avoid strenuous exertion for a couple weeks post chest tube removal then let symptoms be guide     Maryjane Hurter, MD [facility] 07/10/2022 4:26 PM

## 2022-07-11 ENCOUNTER — Ambulatory Visit: Payer: BC Managed Care – PPO | Admitting: Emergency Medicine

## 2022-07-11 ENCOUNTER — Ambulatory Visit: Payer: BC Managed Care – PPO | Admitting: Student

## 2022-07-12 ENCOUNTER — Encounter: Payer: Self-pay | Admitting: Adult Health

## 2022-07-12 ENCOUNTER — Ambulatory Visit (INDEPENDENT_AMBULATORY_CARE_PROVIDER_SITE_OTHER): Payer: BC Managed Care – PPO

## 2022-07-12 ENCOUNTER — Ambulatory Visit (INDEPENDENT_AMBULATORY_CARE_PROVIDER_SITE_OTHER): Payer: BC Managed Care – PPO | Admitting: Adult Health

## 2022-07-12 VITALS — BP 134/86 | HR 77 | Temp 97.7°F | Ht 72.0 in | Wt 212.4 lb

## 2022-07-12 DIAGNOSIS — J9383 Other pneumothorax: Secondary | ICD-10-CM

## 2022-07-12 DIAGNOSIS — R053 Chronic cough: Secondary | ICD-10-CM

## 2022-07-12 DIAGNOSIS — J939 Pneumothorax, unspecified: Secondary | ICD-10-CM | POA: Diagnosis not present

## 2022-07-12 DIAGNOSIS — R059 Cough, unspecified: Secondary | ICD-10-CM | POA: Diagnosis not present

## 2022-07-12 NOTE — Assessment & Plan Note (Signed)
Upper airway cough syndrome/postviral cough.  Would recommend using Delsym twice daily.  Control for triggers.  Claritin as needed. Check chest x-ray today.  Plan  Patient Instructions  Delsym 2 tsp Twice daily  As needed  cough.  Claritin 10mg  daily As needed   Saline nasal As needed   Chest xray today  Follow up with Dr. Verlee Monte in 3 months and As needed   Please contact office for sooner follow up if symptoms do not improve or worsen or seek emergency care

## 2022-07-12 NOTE — Assessment & Plan Note (Signed)
Spontaneous pneumothorax requiring left chest tube.  Now removed.  Patient clinically appears to be improving.  Will check chest x-ray today to make sure no recurrence.  Continue with cough control treatment.  Plan  Patient Instructions  Delsym 2 tsp Twice daily  As needed  cough.  Claritin 10mg  daily As needed   Saline nasal As needed   Chest xray today  Follow up with Dr. Verlee Monte in 3 months and As needed   Please contact office for sooner follow up if symptoms do not improve or worsen or seek emergency care
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
## 2022-07-12 NOTE — Patient Instructions (Signed)
Delsym 2 tsp Twice daily  As needed  cough.  Claritin 10mg  daily As needed   Saline nasal As needed   Chest xray today  Follow up with Dr. Verlee Monte in 3 months and As needed   Please contact office for sooner follow up if symptoms do not improve or worsen or seek emergency care

## 2022-08-27 DIAGNOSIS — K5900 Constipation, unspecified: Secondary | ICD-10-CM | POA: Diagnosis not present

## 2022-08-27 DIAGNOSIS — F439 Reaction to severe stress, unspecified: Secondary | ICD-10-CM | POA: Diagnosis not present
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
## 2022-08-30 ENCOUNTER — Encounter: Payer: Self-pay | Admitting: Nurse Practitioner

## 2022-08-30 ENCOUNTER — Telehealth: Payer: Self-pay

## 2022-08-30 ENCOUNTER — Ambulatory Visit (HOSPITAL_BASED_OUTPATIENT_CLINIC_OR_DEPARTMENT_OTHER)
Admission: RE | Admit: 2022-08-30 | Discharge: 2022-08-30 | Disposition: A | Discharge status: Home / Self Care | Payer: BC Managed Care – PPO | Source: Ambulatory Visit | Attending: Nurse Practitioner | Admitting: Nurse Practitioner

## 2022-08-30 ENCOUNTER — Ambulatory Visit (INDEPENDENT_AMBULATORY_CARE_PROVIDER_SITE_OTHER): Payer: BC Managed Care – PPO | Admitting: Nurse Practitioner

## 2022-08-30 ENCOUNTER — Encounter (HOSPITAL_BASED_OUTPATIENT_CLINIC_OR_DEPARTMENT_OTHER): Payer: Self-pay

## 2022-08-30 VITALS — BP 140/90 | HR 65 | Ht 72.0 in | Wt 206.0 lb

## 2022-08-30 DIAGNOSIS — R1032 Left lower quadrant pain: Secondary | ICD-10-CM | POA: Insufficient documentation

## 2022-08-30 DIAGNOSIS — K5900 Constipation, unspecified: Secondary | ICD-10-CM | POA: Insufficient documentation

## 2022-08-30 DIAGNOSIS — R109 Unspecified abdominal pain: Secondary | ICD-10-CM | POA: Diagnosis not present

## 2022-08-30 DIAGNOSIS — Z800 Family history of malignant neoplasm of digestive organs: Secondary | ICD-10-CM

## 2022-08-30 MED ORDER — IOHEXOL 350 MG/ML SOLN
100.0000 mL | Freq: Once | INTRAVENOUS | Status: AC | PRN
  Administered 2022-08-30: 65 mL via INTRAVENOUS

## 2022-08-30 MED ORDER — NA SULFATE-K SULFATE-MG SULF 17.5-3.13-1.6 GM/177ML PO SOLN: 1.0000 | Freq: Once | ORAL | 0 refills | Status: AC

## 2022-08-30 NOTE — Telephone Encounter (Signed)
[facility] [address] [place], [place]  [address] Phone:  [phone]   Fax:  [phone]   Bryan Willis DOB: [birth year] MRN: [id number]  Dear: Dr.Wert :   The patient above is schedule for a Colonoscopy in the near future under general anesthesia (Propofol).    Please fax or route a note of Medical Clearance to [phone], Attn: Tyshan Enderle, CMA  Please advise if this patient will require an office visit or further medical work-up before clearance can be given.   Thank you,    [facility]

## 2022-08-30 NOTE — Telephone Encounter (Signed)
Cleared for anesthesia for colonoscopy from pulmonary perspective

## 2022-08-30 NOTE — Patient Instructions (Signed)
You have been scheduled for a colonoscopy. Please follow written instructions given to you at your visit today.  Please pick up your prep supplies at the pharmacy within the next 1-3 days. If you use inhalers (even only as needed), please bring them with you on the day of your procedure.  You have been scheduled for a CT scan of the abdomen and pelvis at [facility] 1st floor Radiology. You are scheduled on 08/30/22 at 4:30 pm. You should arrive 2 hours prior to your appointment time for registration.  If you have any questions regarding your exam or if you need to reschedule, you may call [facility] at [phone] between the hours of 8:00 am and 5:00 pm, Monday-Friday.   Miralax- twice daily as tolerated   Due to recent changes in healthcare laws, you may see the results of your imaging and laboratory studies on MyChart before your provider has had a chance to review them.  We understand that in some cases there may be results that are confusing or concerning to you. Not all laboratory results come back in the same time frame and the provider may be waiting for multiple results in order to interpret others.  Please give [place] 48 hours in order for your provider to thoroughly review all the results before contacting the office for clarification of your results.   Thank you for trusting me with your gastrointestinal care!   Carl Best, CRNP

## 2022-08-30 NOTE — Telephone Encounter (Signed)
Contacted pt and pt is understanding that he is cleared for his procedure per pulmonology.

## 2022-09-01 NOTE — Progress Notes (Signed)
____________________________________________________________  Attending physician addendum:  Thank you for sending this case to me. I have reviewed the entire note and agree with the plan.  I will review the CT scan report after you receive it.  Wilfrid Lund, MD  ____________________________________________________________

## 2022-09-17 IMAGING — CT CT CARDIAC CORONARY ARTERY CALCIUM SCORE
3 series · 13 of 20 positions shown, 15 images · non-contrast
Comparison: None.

CLINICAL DATA: 45-year-old Caucasian male with history of elevated
glucose.

EXAM:
CT CARDIAC CORONARY ARTERY CALCIUM SCORE
TECHNIQUE: Non-contrast imaging through the heart was performed using
prospective ECG gating. Image post processing was performed on an
independent workstation, allowing for quantitative analysis of the
heart and coronary arteries. Note that this exam targets the heart
and the chest was not imaged in its entirety.

[Series 2: calcium scoring 2.00 qr36 bestdiast 68% hrt calciu · axial · 0.42mm/px · z∈[+1576,+1632]mm · 3 of 70 slices shown]
[im 14/70  vessel]
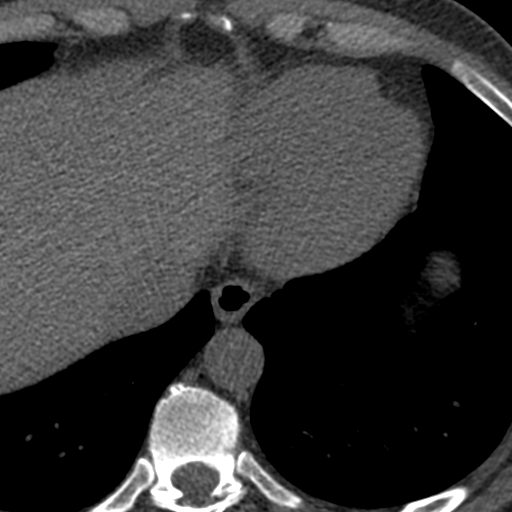
[im 28/70  vessel]
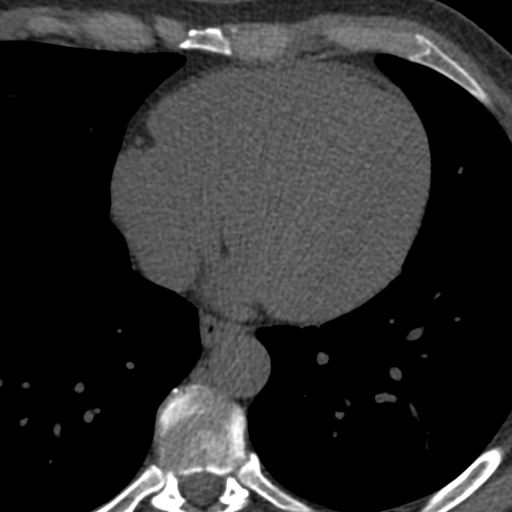
[im 42/70  vessel]
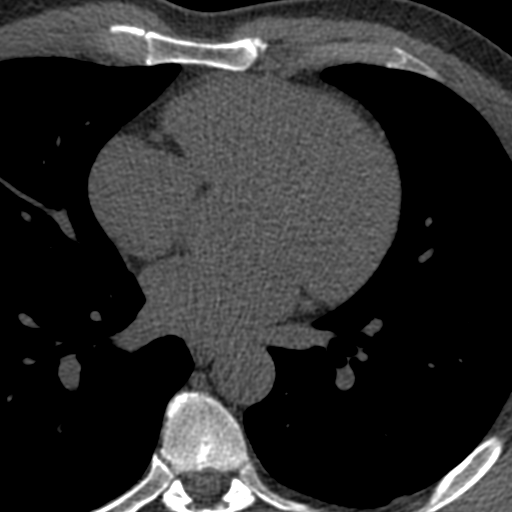

[Series 3: calcium scoring 2.00 br40 bestdiast 68% axial · axial · 0.61mm/px · z∈[+1576,+1680]mm · 5 of 80 slices shown, 7 images]
[im 14/80  vessel]
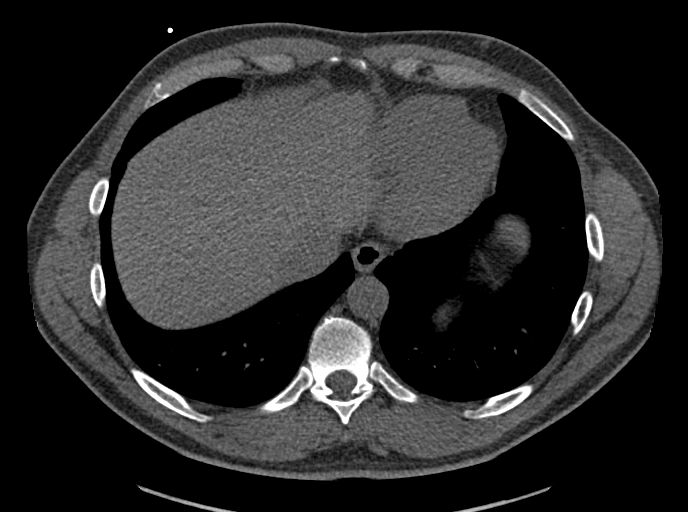
[im 14/80  lung]
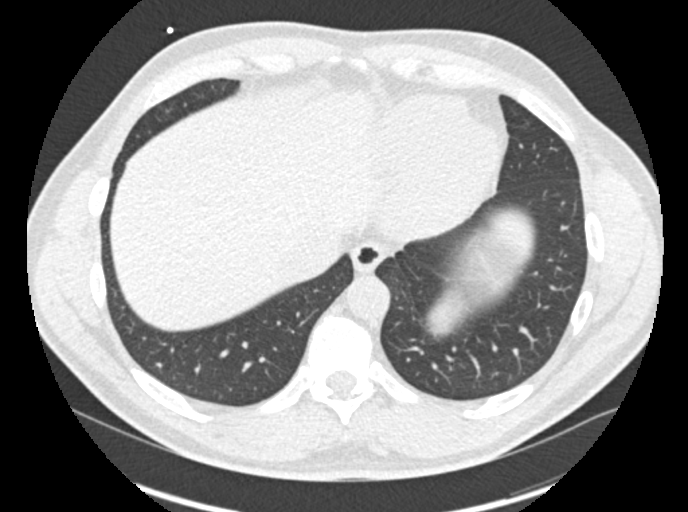
[im 27/80  vessel]
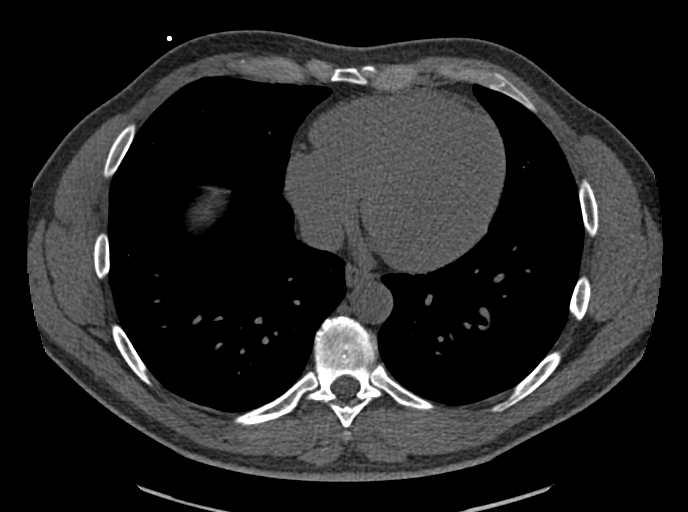
[im 40/80  vessel]
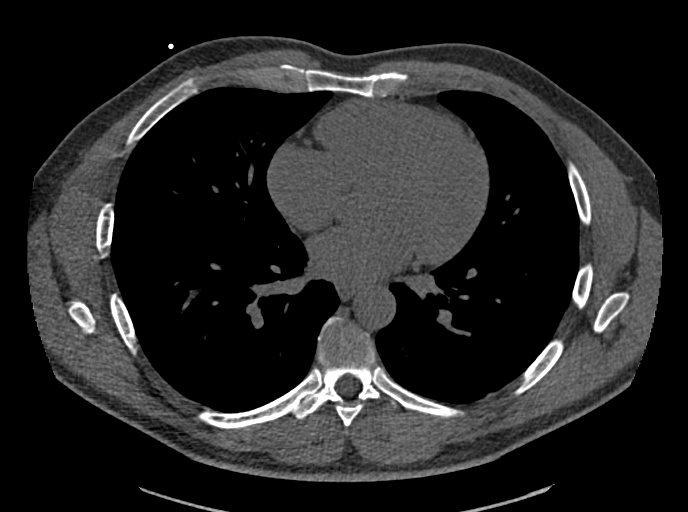
[im 53/80  vessel]
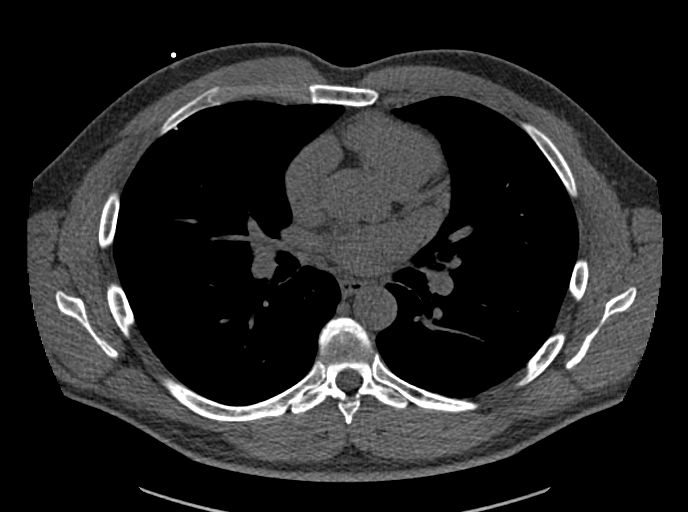
[im 66/80  vessel]
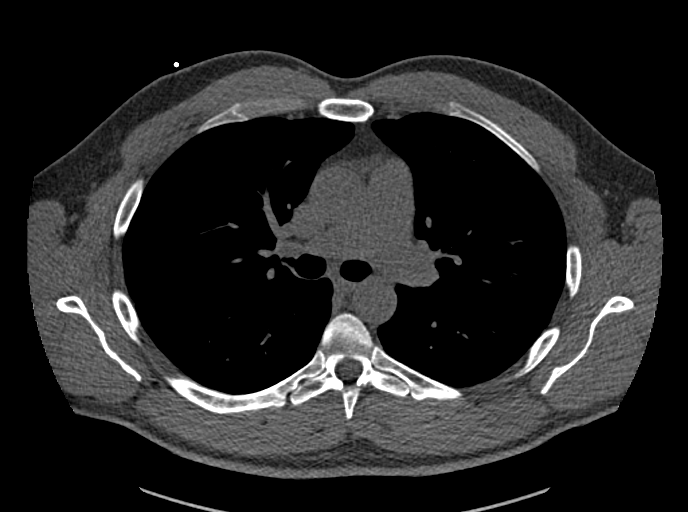
[im 66/80  lung]
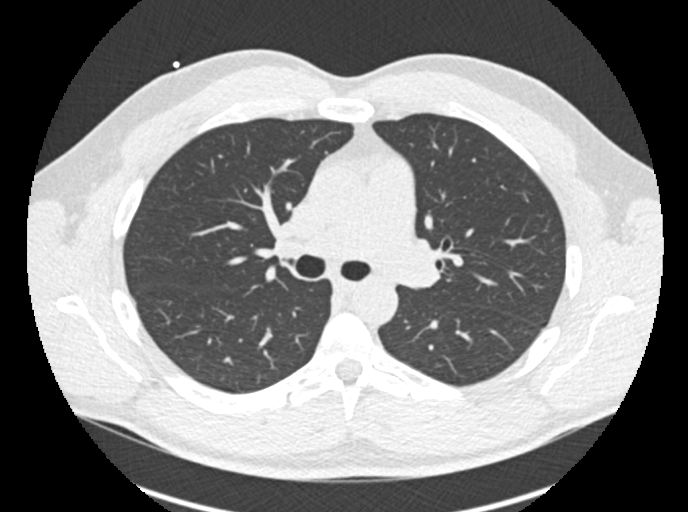

[Series 9: calcium scoring 2.00 br60 bestdiast 68% lungs · axial · 0.61mm/px · z∈[+1576,+1680]mm · 5 of 80 slices shown]
[im 14/80  vessel]
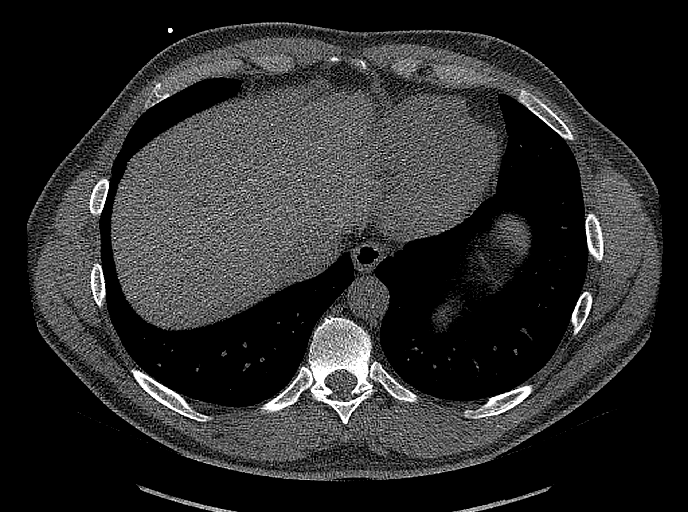
[im 27/80  vessel]
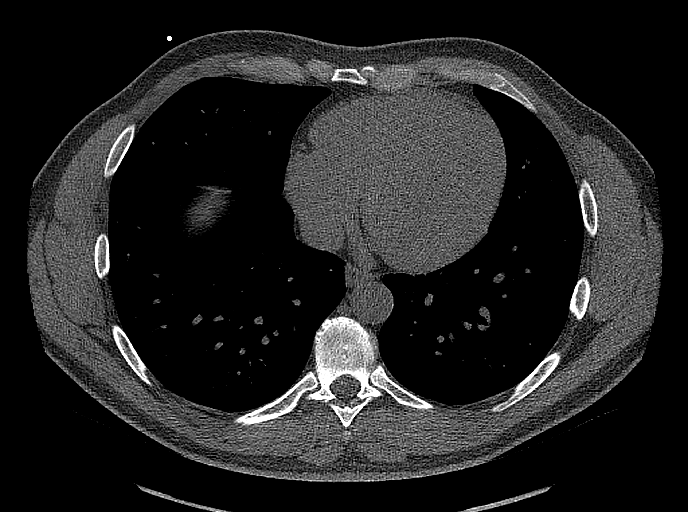
[im 40/80  vessel]
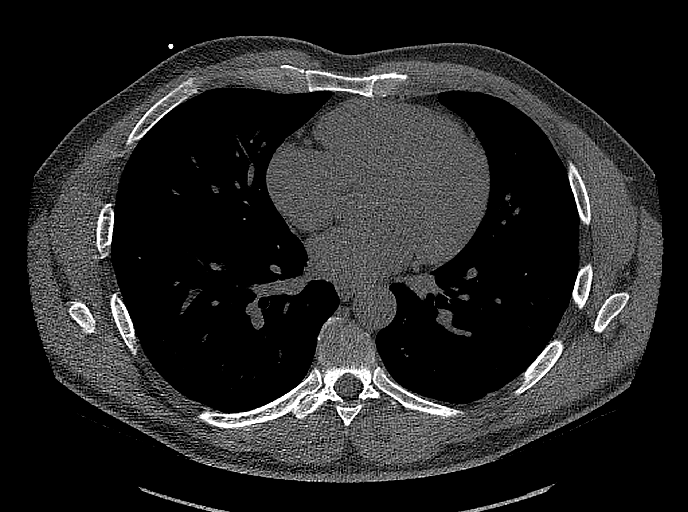
[im 53/80  vessel]
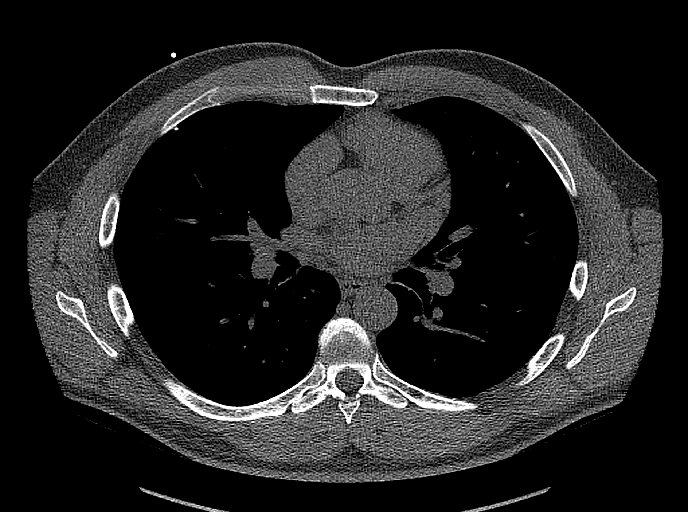
[im 66/80  vessel]
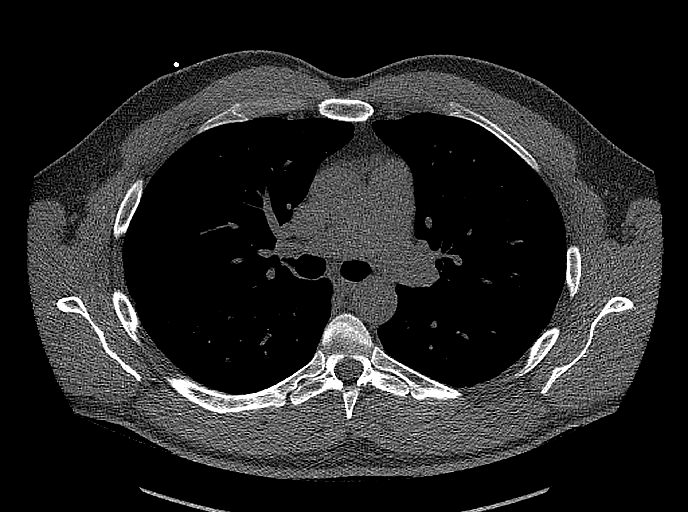

[13 of 20 positions shown; findings below may reference images not displayed]

FINDINGS: CORONARY CALCIUM SCORES:

Left Main: 0

LAD: 0

LCx: 0

RCA: 0

Total Agatston Score: 0

[HOSPITAL] percentile: 0

AORTA MEASUREMENTS:

Ascending Aorta: 33 mm

Descending Aorta: 26 mm

OTHER FINDINGS:

The heart size is within normal limits. No pericardial fluid is
identified. Visualized segments of the thoracic aorta and central
pulmonary arteries are normal in caliber. Visualized mediastinum and
hilar regions demonstrate no lymphadenopathy or masses. Multiple
calcified granulomata of visualized in the right middle lobe.
Additional 4 mm and 3 mm fissural nodules associated with the minor
fissure of the right lung are likely postinflammatory/intrapulmonary
lymph nodes. Visualized lungs show no evidence of pulmonary edema,
consolidation, pneumothorax or pleural fluid. Visualized upper
abdomen and bony structures are unremarkable.
IMPRESSION: 1. Coronary calcium score of 0.
2. Calcified granulomata of the right middle lobe are consistent
with prior granulomatous disease. Additional fissural nodules
associated with the right minor fissure are likely postinflammatory
or intrapulmonary lymph nodes and do not require follow-up.

## 2022-09-23 ENCOUNTER — Encounter: Payer: Self-pay | Admitting: Certified Registered Nurse Anesthetist

## 2022-09-24 ENCOUNTER — Ambulatory Visit (AMBULATORY_SURGERY_CENTER): Payer: BC Managed Care – PPO | Admitting: Gastroenterology

## 2022-09-24 ENCOUNTER — Encounter: Payer: Self-pay | Admitting: Gastroenterology

## 2022-09-24 VITALS — BP 139/92 | HR 80 | Temp 97.3°F | Resp 15 | Ht 72.0 in | Wt 206.0 lb

## 2022-09-24 DIAGNOSIS — R1032 Left lower quadrant pain: Secondary | ICD-10-CM

## 2022-09-24 DIAGNOSIS — Z800 Family history of malignant neoplasm of digestive organs: Secondary | ICD-10-CM

## 2022-09-24 DIAGNOSIS — Z1211 Encounter for screening for malignant neoplasm of colon: Secondary | ICD-10-CM

## 2022-09-24 DIAGNOSIS — K5909 Other constipation: Secondary | ICD-10-CM

## 2022-09-24 MED ORDER — SODIUM CHLORIDE 0.9 % IV SOLN: 500.0000 mL | Freq: Once | INTRAVENOUS | Status: DC

## 2022-09-24 NOTE — Op Note (Signed)
[facility] Patient Name: Bryan Willis Procedure Date: 09/24/2022 2:39 PM MRN: [id number] Endoscopist: Sherilyn Cooter L. Myrtie Neither , MD, [id number] Age: 47 Referring MD:  Date of Birth: 28-Jan-1976 Gender: Male Account #: [id number] Procedure:                Colonoscopy Indications:              Colon cancer screening in patient at increased                            risk: Colorectal cancer in father                           No polyps last colonoscopy nov 2018                           Incidental constipation noted (few months, since an                            acute illness) - recent CTAP nml Medicines:                Monitored Anesthesia Care Procedure:                Pre-Anesthesia Assessment:                           - Prior to the procedure, a History and Physical                            was performed, and patient medications and                            allergies were reviewed. The patient's tolerance of                            previous anesthesia was also reviewed. The risks                            and benefits of the procedure and the sedation                            options and risks were discussed with the patient.                            All questions were answered, and informed consent                            was obtained. Prior Anticoagulants: The patient has                            taken no anticoagulant or antiplatelet agents. ASA                            Grade Assessment: II - A patient with mild systemic  disease. After reviewing the risks and benefits,                            the patient was deemed in satisfactory condition to                            undergo the procedure.                           After obtaining informed consent, the colonoscope                            was passed under direct vision. Throughout the                            procedure, the patient's blood pressure, pulse, and                             oxygen saturations were monitored continuously. The                            CF [id number] #[id number] was introduced through the anus                            and advanced to the the terminal ileum, with                            identification of the appendiceal orifice and IC                            valve. The colonoscopy was performed without                            difficulty. The patient tolerated the procedure                            well. The quality of the bowel preparation was                            excellent. The terminal ileum, ileocecal valve,                            appendiceal orifice, and rectum were photographed. Scope In: 3:01:29 PM Scope Out: 3:14:27 PM Scope Withdrawal Time: 0 hours 9 minutes 41 seconds  Total Procedure Duration: 0 hours 12 minutes 58 seconds  Findings:                 The perianal and digital rectal examinations were                            normal.                           The terminal ileum appeared normal.  Repeat examination of right colon under NBI                            performed.                           Internal hemorrhoids were found. The hemorrhoids                            were Grade I (internal hemorrhoids that do not                            prolapse).                           The exam was otherwise without abnormality on                            direct and retroflexion views. Complications:            No immediate complications. Estimated Blood Loss:     Estimated blood loss: none. Impression:               - The examined portion of the ileum was normal.                           - Internal hemorrhoids.                           - The examination was otherwise normal on direct                            and retroflexion views.                           - No specimens collected.                           - The GI Genius (intelligent endoscopy module),                             computer-aided polyp detection system powered by AI                            was utilized to detect colorectal polyps through                            enhanced visualization during colonoscopy. Recommendation:           - Patient has a contact number available for                            emergencies. The signs and symptoms of potential                            delayed complications were discussed with the  patient. Return to normal activities tomorrow.                            Written discharge instructions were provided to the                            patient.                           - Resume previous diet.                           - Continue present medications.                           - Repeat colonoscopy in 5 years for screening                            purposes. Andrey Mccaskill L. Myrtie Neitheranis, MD 09/24/2022 3:19:09 PM This report has been signed electronically.

## 2022-09-24 NOTE — Progress Notes (Signed)
Report given to PACU, vss 

## 2022-09-24 NOTE — Progress Notes (Signed)
No changes to clinical history since GI office visit on 08/30/22. CTAP unrevealing for symptoms  The patient is appropriate for an endoscopic procedure in the ambulatory setting.  - Amada Jupiter, MD

## 2022-09-24 NOTE — Patient Instructions (Signed)
YOU HAD AN ENDOSCOPIC PROCEDURE TODAY AT THE [facility]:   Refer to the procedure report that was given to you for any specific questions about what was found during the examination.  If the procedure report does not answer your questions, please call your gastroenterologist to clarify.  If you requested that your care partner not be given the details of your procedure findings, then the procedure report has been included in a sealed envelope for you to review at your convenience later.  **Handout given on Hemorrhoids**  YOU SHOULD EXPECT: Some feelings of bloating in the abdomen. Passage of more gas than usual.  Walking can help get rid of the air that was put into your GI tract during the procedure and reduce the bloating. If you had a lower endoscopy (such as a colonoscopy or flexible sigmoidoscopy) you may notice spotting of blood in your stool or on the toilet paper. If you underwent a bowel prep for your procedure, you may not have a normal bowel movement for a few days.  Please Note:  You might notice some irritation and congestion in your nose or some drainage.  This is from the oxygen used during your procedure.  There is no need for concern and it should clear up in a day or so.  SYMPTOMS TO REPORT IMMEDIATELY:  Following lower endoscopy (colonoscopy or flexible sigmoidoscopy):  Excessive amounts of blood in the stool  Significant tenderness or worsening of abdominal pains  Swelling of the abdomen that is new, acute  Fever of 100F or higher  For urgent or emergent issues, a gastroenterologist can be reached at any hour by calling [phone]. Do not use MyChart messaging for urgent concerns.    DIET:  We do recommend a small meal at first, but then you may proceed to your regular diet.  Drink plenty of fluids but you should avoid alcoholic beverages for 24 hours.  ACTIVITY:  You should plan to take it easy for the rest of today and you should NOT DRIVE or use heavy  machinery until tomorrow (because of the sedation medicines used during the test).    FOLLOW UP: Our staff will call the number listed on your records the next business day following your procedure.  We will call around 7:15- 8:00 am to check on you and address any questions or concerns that you may have regarding the information given to you following your procedure. If we do not reach you, we will leave a message.     If any biopsies were taken you will be contacted by phone or by letter within the next 1-3 weeks.  Please call us at [phone] if you have not heard about the biopsies in 3 weeks.    SIGNATURES/CONFIDENTIALITY: You and/or your care partner have signed paperwork which will be entered into your electronic medical record.  These signatures attest to the fact that that the information above on your After Visit Summary has been reviewed and is understood.  Full responsibility of the confidentiality of this discharge information lies with you and/or your care-partner. 

## 2022-09-25 ENCOUNTER — Telehealth: Payer: Self-pay

## 2022-09-25 NOTE — Telephone Encounter (Signed)
  Follow up Call-     09/24/2022    2:43 PM  Call back number  Post procedure Call Back phone  # [phone]  Permission to leave phone message Yes     Patient questions:  Do you have a fever, pain , or abdominal swelling? No. Pain Score  0 *  Have you tolerated food without any problems? Yes.    Have you been able to return to your normal activities? Yes.    Do you have any questions about your discharge instructions: Diet   No. Medications  No. Follow up visit  No.  Do you have questions or concerns about your Care? No.  Actions: * If pain score is 4 or above: No action needed, pain <4.

## 2022-12-13 DIAGNOSIS — N529 Male erectile dysfunction, unspecified: Secondary | ICD-10-CM | POA: Diagnosis not present

## 2022-12-18 DIAGNOSIS — N529 Male erectile dysfunction, unspecified: Secondary | ICD-10-CM | POA: Diagnosis not present

## 2023-01-09 DIAGNOSIS — L814 Other melanin hyperpigmentation: Secondary | ICD-10-CM | POA: Diagnosis not present

## 2023-01-09 DIAGNOSIS — D225 Melanocytic nevi of trunk: Secondary | ICD-10-CM | POA: Diagnosis not present

## 2023-01-09 DIAGNOSIS — L570 Actinic keratosis: Secondary | ICD-10-CM | POA: Diagnosis not present

## 2023-01-09 DIAGNOSIS — L821 Other seborrheic keratosis: Secondary | ICD-10-CM | POA: Diagnosis not present
